# Patient Record
Sex: Male | Born: 1965
Health system: Southern US, Community
[De-identification: ages and names within clinical notes are randomized; demographics above are authoritative.]

## PROBLEM LIST (undated history)

## (undated) DIAGNOSIS — I10 Essential (primary) hypertension: Secondary | ICD-10-CM

## (undated) DIAGNOSIS — K219 Gastro-esophageal reflux disease without esophagitis: Secondary | ICD-10-CM

## (undated) DIAGNOSIS — E7800 Pure hypercholesterolemia, unspecified: Secondary | ICD-10-CM

## (undated) DIAGNOSIS — C73 Malignant neoplasm of thyroid gland: Secondary | ICD-10-CM

## (undated) DIAGNOSIS — G4730 Sleep apnea, unspecified: Secondary | ICD-10-CM

## (undated) DIAGNOSIS — R609 Edema, unspecified: Secondary | ICD-10-CM

## (undated) DIAGNOSIS — E890 Postprocedural hypothyroidism: Secondary | ICD-10-CM

## (undated) HISTORY — PX: COLONOSCOPY: SHX174

## (undated) HISTORY — DX: Hypocalcemia: E83.51

## (undated) HISTORY — DX: Gastro-esophageal reflux disease without esophagitis: K21.9

## (undated) HISTORY — DX: Edema, unspecified: R60.9

## (undated) HISTORY — DX: Postprocedural hypothyroidism: E89.0

## (undated) HISTORY — DX: Malignant neoplasm of thyroid gland: C73

## (undated) HISTORY — DX: Essential (primary) hypertension: I10

---

## 2003-10-28 HISTORY — PX: UVULOPALATOPHARYNGOPLASTY: SHX827

## 2009-01-26 ENCOUNTER — Encounter (INDEPENDENT_AMBULATORY_CARE_PROVIDER_SITE_OTHER): Payer: Self-pay | Admitting: Diagnostic Radiology

## 2009-01-26 ENCOUNTER — Ambulatory Visit (HOSPITAL_COMMUNITY): Admission: RE | Admit: 2009-01-26 | Discharge: 2009-01-26 | Payer: Self-pay | Admitting: Endocrinology

## 2009-03-02 ENCOUNTER — Ambulatory Visit (HOSPITAL_COMMUNITY): Admission: RE | Admit: 2009-03-02 | Discharge: 2009-03-03 | Payer: Self-pay | Admitting: General Surgery

## 2009-03-02 ENCOUNTER — Encounter (INDEPENDENT_AMBULATORY_CARE_PROVIDER_SITE_OTHER): Payer: Self-pay | Admitting: General Surgery

## 2009-03-02 HISTORY — PX: TOTAL THYROIDECTOMY: SHX2547

## 2009-05-02 ENCOUNTER — Ambulatory Visit: Payer: Self-pay | Admitting: Endocrinology

## 2009-05-02 DIAGNOSIS — C73 Malignant neoplasm of thyroid gland: Secondary | ICD-10-CM

## 2009-05-02 DIAGNOSIS — K219 Gastro-esophageal reflux disease without esophagitis: Secondary | ICD-10-CM

## 2009-05-02 DIAGNOSIS — E890 Postprocedural hypothyroidism: Secondary | ICD-10-CM

## 2009-05-02 DIAGNOSIS — I10 Essential (primary) hypertension: Secondary | ICD-10-CM

## 2009-05-02 HISTORY — DX: Malignant neoplasm of thyroid gland: C73

## 2009-05-02 HISTORY — DX: Essential (primary) hypertension: I10

## 2009-05-02 HISTORY — DX: Gastro-esophageal reflux disease without esophagitis: K21.9

## 2009-05-02 HISTORY — DX: Postprocedural hypothyroidism: E89.0

## 2009-05-11 ENCOUNTER — Encounter (HOSPITAL_COMMUNITY): Admission: RE | Admit: 2009-05-11 | Discharge: 2009-07-26 | Payer: Self-pay | Admitting: Endocrinology

## 2009-05-21 ENCOUNTER — Encounter (HOSPITAL_COMMUNITY): Admission: RE | Admit: 2009-05-21 | Discharge: 2009-07-26 | Payer: Self-pay | Admitting: Endocrinology

## 2009-05-23 ENCOUNTER — Telehealth: Payer: Self-pay | Admitting: Internal Medicine

## 2009-06-06 ENCOUNTER — Encounter: Payer: Self-pay | Admitting: Endocrinology

## 2009-06-08 ENCOUNTER — Telehealth: Payer: Self-pay | Admitting: Endocrinology

## 2009-06-11 ENCOUNTER — Telehealth: Payer: Self-pay | Admitting: Endocrinology

## 2009-06-15 ENCOUNTER — Ambulatory Visit: Payer: Self-pay | Admitting: Endocrinology

## 2009-07-24 ENCOUNTER — Ambulatory Visit: Payer: Self-pay | Admitting: Endocrinology

## 2009-07-26 LAB — CONVERTED CEMR LAB: TSH: 0.4 microintl units/mL (ref 0.35–5.50)

## 2009-08-16 DIAGNOSIS — D229 Melanocytic nevi, unspecified: Secondary | ICD-10-CM

## 2009-08-16 HISTORY — DX: Melanocytic nevi, unspecified: D22.9

## 2009-09-07 ENCOUNTER — Telehealth (INDEPENDENT_AMBULATORY_CARE_PROVIDER_SITE_OTHER): Payer: Self-pay | Admitting: *Deleted

## 2009-09-28 ENCOUNTER — Ambulatory Visit: Payer: Self-pay | Admitting: Endocrinology

## 2009-09-29 LAB — CONVERTED CEMR LAB
TSH: 56.91 microintl units/mL — ABNORMAL HIGH (ref 0.35–5.50)
Thyroglobulin Ab: 51.7 (ref 0.0–60.0)

## 2009-10-02 ENCOUNTER — Ambulatory Visit: Payer: Self-pay | Admitting: Endocrinology

## 2009-10-03 ENCOUNTER — Telehealth: Payer: Self-pay | Admitting: Endocrinology

## 2009-10-15 ENCOUNTER — Encounter: Payer: Self-pay | Admitting: Endocrinology

## 2009-10-15 ENCOUNTER — Ambulatory Visit (HOSPITAL_COMMUNITY): Admission: RE | Admit: 2009-10-15 | Discharge: 2009-10-15 | Payer: Self-pay | Admitting: Endocrinology

## 2009-10-15 ENCOUNTER — Telehealth: Payer: Self-pay | Admitting: Endocrinology

## 2009-10-23 ENCOUNTER — Telehealth: Payer: Self-pay | Admitting: Endocrinology

## 2009-10-23 ENCOUNTER — Ambulatory Visit: Payer: Self-pay | Admitting: Endocrinology

## 2009-10-23 LAB — CONVERTED CEMR LAB: TSH: 83.58 microintl units/mL — ABNORMAL HIGH (ref 0.35–5.50)

## 2009-10-24 ENCOUNTER — Telehealth: Payer: Self-pay | Admitting: Endocrinology

## 2009-10-31 ENCOUNTER — Telehealth: Payer: Self-pay | Admitting: Endocrinology

## 2009-10-31 ENCOUNTER — Ambulatory Visit (HOSPITAL_COMMUNITY): Admission: RE | Admit: 2009-10-31 | Discharge: 2009-10-31 | Payer: Self-pay | Admitting: Endocrinology

## 2009-11-02 ENCOUNTER — Ambulatory Visit (HOSPITAL_COMMUNITY): Admission: RE | Admit: 2009-11-02 | Discharge: 2009-11-02 | Payer: Self-pay | Admitting: Endocrinology

## 2009-11-02 ENCOUNTER — Telehealth: Payer: Self-pay | Admitting: Endocrinology

## 2009-11-12 ENCOUNTER — Ambulatory Visit: Payer: Self-pay | Admitting: Internal Medicine

## 2009-11-12 ENCOUNTER — Ambulatory Visit (HOSPITAL_COMMUNITY): Admission: RE | Admit: 2009-11-12 | Discharge: 2009-11-12 | Payer: Self-pay | Admitting: Endocrinology

## 2009-11-12 DIAGNOSIS — R609 Edema, unspecified: Secondary | ICD-10-CM

## 2009-11-12 HISTORY — DX: Edema, unspecified: R60.9

## 2009-12-17 ENCOUNTER — Encounter: Payer: Self-pay | Admitting: Endocrinology

## 2009-12-20 ENCOUNTER — Ambulatory Visit: Payer: Self-pay | Admitting: Endocrinology

## 2009-12-24 ENCOUNTER — Encounter: Payer: Self-pay | Admitting: Internal Medicine

## 2009-12-24 ENCOUNTER — Ambulatory Visit (HOSPITAL_COMMUNITY): Admission: RE | Admit: 2009-12-24 | Discharge: 2009-12-24 | Payer: Self-pay | Admitting: General Surgery

## 2009-12-30 ENCOUNTER — Telehealth: Payer: Self-pay | Admitting: Endocrinology

## 2010-01-07 ENCOUNTER — Encounter: Payer: Self-pay | Admitting: Endocrinology

## 2010-03-15 ENCOUNTER — Ambulatory Visit: Payer: Self-pay | Admitting: Endocrinology

## 2010-03-18 LAB — CONVERTED CEMR LAB: Thyroglobulin Ab: 48 (ref 0.0–60.0)

## 2010-03-20 ENCOUNTER — Ambulatory Visit: Payer: Self-pay | Admitting: Endocrinology

## 2010-04-26 ENCOUNTER — Ambulatory Visit: Payer: Self-pay | Admitting: Endocrinology

## 2010-05-02 ENCOUNTER — Telehealth: Payer: Self-pay | Admitting: Endocrinology

## 2010-05-10 IMAGING — CT NM PET TUM IMG RESTAG (PS) SKULL BASE T - THIGH
1 of 6 series · 1 of 25 positions shown · IV contrast (350 RC)
Comparison: 10/18/2009

CLINICAL DATA: Initial treatment strategy for malignant neoplasm
of the thyroid gland.

NUCLEAR MEDICINE PET CT INITIAL (PI) SKULL BASE TO THIGH
TECHNIQUE: 14.8 mCi F-18 FDG was injected intravenously via the
right antecubital fossa.  Full-ring PET imaging was performed from
the skull base through the mid-thighs 55  minutes after injection.
CT data was obtained and used for attenuation correction and
anatomic localization only.  (This was not acquired as a diagnostic
CT examination.)
Fasting Blood Glucose:  111

[Series 2: ct images · axial · 3.8mm · 0.98mm/px · 1 of 307 slices shown]
[im 307/307  brain]
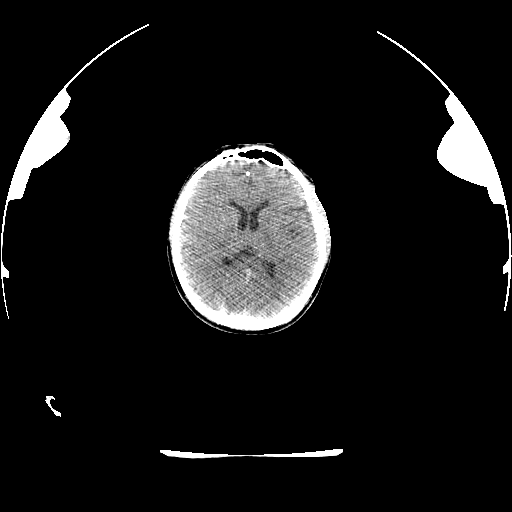

[1 of 25 positions shown; findings below may reference images not displayed]

FINDINGS: No hypermetabolic lymph nodes within the neck.

Postoperative change consistent with prior thyroidectomy and
ablation.

No hypermetabolic cervical lymph nodes.

Left supraclavicular lymph node measures 0.7 cm.  This has an SUV
max equal to 3.1.

No hypermetabolic axillary lymph nodes.

There is no abnormal FDG uptake within the mediastinum or hilum.

No pericardial or pleural effusions identified.

No hypermetabolic pulmonary nodules or masses identified.

There is no abnormal uptake within the liver or spleen.

The adrenal glands both appear normal.

The pancreas appears normal.

No hypermetabolic pelvic or inguinal lymph nodes.

There are no specific features to suggest  hypermetabolic bone
metastases.
IMPRESSION: 1.  There is a single left supraclavicular lymph node which
exhibits mild to moderate increased FDG uptake.  This may represent
a focus of thyroid cancer metastases.
2.  No evidence for hypermetabolic tumor within the chest ,abdomen
or pelvis.

## 2010-05-13 ENCOUNTER — Encounter (HOSPITAL_COMMUNITY): Admission: RE | Admit: 2010-05-13 | Discharge: 2010-07-24 | Payer: Self-pay | Admitting: Endocrinology

## 2010-06-18 ENCOUNTER — Encounter: Payer: Self-pay | Admitting: Endocrinology

## 2010-07-04 ENCOUNTER — Ambulatory Visit: Payer: Self-pay | Admitting: Endocrinology

## 2010-07-04 HISTORY — DX: Hypocalcemia: E83.51

## 2010-07-04 LAB — CONVERTED CEMR LAB
Calcium, Total (PTH): 9.5 mg/dL (ref 8.4–10.5)
PTH: 46.9 pg/mL (ref 14.0–72.0)
TSH: 0.36 u[IU]/mL (ref 0.35–5.50)

## 2010-08-01 ENCOUNTER — Telehealth: Payer: Self-pay | Admitting: Endocrinology

## 2010-08-30 ENCOUNTER — Ambulatory Visit: Payer: Self-pay | Admitting: Endocrinology

## 2010-09-11 ENCOUNTER — Ambulatory Visit: Payer: Self-pay | Admitting: Endocrinology

## 2010-09-12 LAB — CONVERTED CEMR LAB: TSH: 21.87 microintl units/mL — ABNORMAL HIGH (ref 0.35–5.50)

## 2010-11-17 ENCOUNTER — Encounter: Payer: Self-pay | Admitting: Endocrinology

## 2010-11-18 ENCOUNTER — Encounter: Payer: Self-pay | Admitting: Endocrinology

## 2010-11-24 LAB — CONVERTED CEMR LAB
AST: 44 units/L — ABNORMAL HIGH (ref 0–37)
Albumin: 4.5 g/dL (ref 3.5–5.2)
BUN: 14 mg/dL (ref 6–23)
Basophils Absolute: 0 10*3/uL (ref 0.0–0.1)
CO2: 28 meq/L (ref 19–32)
Chloride: 106 meq/L (ref 96–112)
Eosinophils Absolute: 0.1 10*3/uL (ref 0.0–0.7)
Glucose, Bld: 84 mg/dL (ref 70–99)
HCT: 42.3 % (ref 39.0–52.0)
Hemoglobin, Urine: NEGATIVE
Hemoglobin: 14.2 g/dL (ref 13.0–17.0)
Lymphs Abs: 0.9 10*3/uL (ref 0.7–4.0)
MCHC: 33.6 g/dL (ref 30.0–36.0)
MCV: 90.3 fL (ref 78.0–100.0)
Neutro Abs: 2.2 10*3/uL (ref 1.4–7.7)
Nitrite: NEGATIVE
Potassium: 3.8 meq/L (ref 3.5–5.1)
RDW: 13 % (ref 11.5–14.6)
Specific Gravity, Urine: 1.01 (ref 1.000–1.030)
Total Protein, Urine: NEGATIVE mg/dL

## 2010-11-26 NOTE — Progress Notes (Signed)
Summary: Pt req  Phone Note Call from Patient Call back at Home Phone [phone]   Caller: Spouse Summary of Call: Pt's spouse called requesting to take Cytomel for a while and then stop, while waiting for scan? Initial call taken by: Margaret Pyle, CMA,  May 02, 2010 3:14 PM  Follow-up for Phone Call        if you had to wait longer for the scan, that would be a good idea.  however, good news--you are ready for the scan now.  you will be called with a day and time for an appointment. Follow-up by: Minus Breeding MD,  May 02, 2010 3:57 PM  Additional Follow-up for Phone Call Additional follow up Details #1::        Pt's advised and was irate stating that SAE advised that appt was supposed to be mad last week. Additional Follow-up by: Margaret Pyle, CMA,  May 02, 2010 4:26 PM

## 2010-11-26 NOTE — Letter (Signed)
Summary: [facility]   Imported By: Sherian Rein 03/01/2010 13:49:47  _____________________________________________________________________  External Attachment:    Type:   Image     Comment:   External Document

## 2010-11-26 NOTE — Assessment & Plan Note (Signed)
Summary: "swollen all over" / sae / cd   Vital Signs:  Patient profile:   45 year old male Height:      74 inches Weight:      248 pounds BMI:     31.96 O2 Sat:      95 % on Room air Temp:     99.2 degrees F oral Pulse rate:   81 / minute BP sitting:   150 / 104  (left arm) Cuff size:   large  Vitals Entered ByZella Ball Ewing (November 12, 2009 1:29 PM)  O2 Flow:  Room air CC: swollen all over/RE   Primary Care Provider:  Dr Doreen Beam  CC:  swollen all over/RE.  History of Present Illness: s/p  radioactive Iodine in july and early jan, after surgical  removal in may 2010;  here today wih low grade temp, St and hoarsenss, as well a puffiness to hand and ankles; abd sweling, feels cold ,memory slowed down,  dry mouth , cheek swelling;  now back on the thyroid medication for 3 days only., requesting mild diuretic due to discomfort to the legs; wt up from 232 to 248 - mostly fluid he suspects but also had the holidays in between;   has appt approx 6 wks to return;  has some change in urination with overall less urine volume;  similar overall to episode of fluid retention in july 2010 but "took forever for the swelling to go away";  Pt denies CP, sob, doe, wheezing, orthopnea, pnd, palps, dizziness or syncope . Pt denies new neuro symptoms such as headache, facial or extremity weakness     Problems Prior to Update: 1)  Peripheral Edema  (ICD-782.3) 2)  Hypothyroidism, Postsurgical  (ICD-244.0) 3)  Hypertension  (ICD-401.9) 4)  G E R D  (ICD-530.81) 5)  Malignant Neoplasm of Thyroid Gland  (ICD-193)  Medications Prior to Update: 1)  Lisinopril 20 Mg Tabs (Lisinopril) .Marland Kitchen.. 1 Tab Two Times A Day 2)  Omeprazole 40 Mg Cpdr (Omeprazole) .Marland Kitchen.. 1 Daily 3)  Levothyroxine Sodium 200 Mcg Solr (Levothyroxine Sodium) .Marland Kitchen.. 1 Qd 4)  Thyroid Nuc Med Total Body Scan .Marland Kitchen.. 193  Current Medications (verified): 1)  Lisinopril 20 Mg Tabs (Lisinopril) .Marland Kitchen.. 1 Tab Two Times A Day 2)  Omeprazole 40 Mg Cpdr  (Omeprazole) .Marland Kitchen.. 1 Daily 3)  Levothyroxine Sodium 200 Mcg Solr (Levothyroxine Sodium) .Marland Kitchen.. 1 Qd 4)  Thyroid Nuc Med Total Body Scan .Marland Kitchen.. 193 5)  Furosemide 20 Mg Tabs (Furosemide) .Marland Kitchen.. 1po Once Daily As Needed Swelling 6)  Klor-Con 10 10 Meq Cr-Tabs (Potassium Chloride) .Marland Kitchen.. 1 By Mouth Once Daily  Allergies (verified): No Known Drug Allergies  Past History:  Past Medical History: Last updated: 07/24/2009 HYPOTHYROIDISM, POSTSURGICAL (ICD-244.0) HYPERTENSION (ICD-401.9) G E R D (ICD-530.81) MALIGNANT NEOPLASM OF THYROID GLAND (ICD-193) follicular variant of papillary thyroid cancer 5/10  thyroidectomy, t3 M1 No 7/10  i-131 156 mci 7/10  post-therapy scan has uptake at the thyroid bed only  Past Surgical History: Last updated: 05/02/2009 Complete Throidectomy- Mar 02, 2009 Sleep Apnea Surg- 2005  Social History: Last updated: 05/02/2009 married works as a Chartered certified accountant  Review of Systems       all otherwise negative per pt -  Physical Exam  General:  alert and overweight-appearing.   Head:  normocephalic and atraumatic.   Eyes:  vision grossly intact, pupils equal, and pupils round.   Ears:  R ear normal and L ear normal.   Nose:  no external  deformity and no nasal discharge.   Mouth:  no gingival abnormalities and pharynx pink and moist.   Neck:  supple and no masses.   Lungs:  normal respiratory effort and normal breath sounds.   Heart:  normal rate and regular rhythm.   Abdomen:  soft, non-tender, and normal bowel sounds. but somewhat distended ? ascites Msk:  no joint tenderness and no joint swelling.   Extremities:  1+ left pedal edema. bilat Neurologic:  alert & oriented X3 and strength normal in all extremities.     Impression & Recommendations:  Problem # 1:  HYPOTHYROIDISM, POSTSURGICAL (ICD-244.0)  His updated medication list for this problem includes:    Levothyroxine Sodium 200 Mcg Solr (Levothyroxine sodium) .Marland Kitchen... 1 qd just re-started levothyroxine ,  to cont same dose for now, recent TSH 84 on dec 28; has f/u with dr Everardo All scheduled  Problem # 2:  PERIPHERAL EDEMA (ICD-782.3)  most likely due to above, cant r/o mild ascites as well;; to check ekg, and routine labs to r/o renal, anemia, etc  - to add the lasix 20 mg as needed, ok to go back to work tomorrow as planned Orders: EKG w/ Interpretation (93000) TLB-BMP (Basic Metabolic Panel-BMET) (80048-METABOL) TLB-CBC Platelet - w/Differential (85025-CBCD) TLB-Hepatic/Liver Function Pnl (80076-HEPATIC) TLB-Udip ONLY (81003-UDIP)  His updated medication list for this problem includes:    Furosemide 20 Mg Tabs (Furosemide) .Marland Kitchen... 1po once daily as needed swelling  Problem # 3:  HYPERTENSION (ICD-401.9)  His updated medication list for this problem includes:    Lisinopril 20 Mg Tabs (Lisinopril) .Marland Kitchen... 1 tab two times a day    Furosemide 20 Mg Tabs (Furosemide) .Marland Kitchen... 1po once daily as needed swelling  BP today: 150/104 Prior BP: 122/90 (07/24/2009) mild elev today, likely situational, ok to follow, continue same treatment   Complete Medication List: 1)  Lisinopril 20 Mg Tabs (Lisinopril) .Marland Kitchen.. 1 tab two times a day 2)  Omeprazole 40 Mg Cpdr (Omeprazole) .Marland Kitchen.. 1 daily 3)  Levothyroxine Sodium 200 Mcg Solr (Levothyroxine sodium) .Marland Kitchen.. 1 qd 4)  Thyroid Nuc Med Total Body Scan  .Marland Kitchen.. 193 5)  Furosemide 20 Mg Tabs (Furosemide) .Marland Kitchen.. 1po once daily as needed swelling 6)  Klor-con 10 10 Meq Cr-tabs (Potassium chloride) .Marland Kitchen.. 1 by mouth once daily  Patient Instructions: 1)  Please take all new medications as prescribed 2)  Continue all previous medications as before this visit  3)  Your EKG was good today 4)  Please go to the Lab in the basement for your blood and/or urine tests today 5)  please followup with Dr Everardo All as planned  Prescriptions: KLOR-CON 10 10 MEQ CR-TABS (POTASSIUM CHLORIDE) 1 by mouth once daily  #30 x 1   Entered and Authorized by:   Corwin Levins MD   Signed by:   Corwin Levins MD on 11/12/2009   Method used:   Print then Give to Patient   RxID:   [id number] FUROSEMIDE 20 MG TABS (FUROSEMIDE) 1po once daily as needed swelling  #30 x 1   Entered and Authorized by:   Corwin Levins MD   Signed by:   Corwin Levins MD on 11/12/2009   Method used:   Print then Give to Patient   RxID:   [id number]

## 2010-11-26 NOTE — Letter (Signed)
Summary: [facility]   Imported By: Lanelle Bal 01/21/2010 11:27:04  _____________________________________________________________________  External Attachment:    Type:   Image     Comment:   External Document

## 2010-11-26 NOTE — Letter (Signed)
Summary: [facility]   Imported By: Sherian Rein 12/24/2009 14:41:40  _____________________________________________________________________  External Attachment:    Type:   Image     Comment:   External Document

## 2010-11-26 NOTE — Progress Notes (Signed)
Summary: Levothyroxine Question  Phone Note Call from Patient Call back at Home Phone [phone]   Summary of Call: Patient spouse called, and patient will require another body scan on 11-12-09 at 7:30 am. What should patient do in regards to his levothyroxine? Please advise. Initial call taken by: Lucious Groves,  November 02, 2009 3:15 PM  Follow-up for Phone Call        he can resume his medication 2 days after his treatment pill (which i believe was today).   Follow-up by: Minus Breeding MD,  November 02, 2009 3:43 PM  Additional Follow-up for Phone Call Additional follow up Details #1::        Patient aware but is unsure if it should be stopped again before the scan on the 17th. Additional Follow-up by: Lucious Groves,  November 02, 2009 3:45 PM    Additional Follow-up for Phone Call Additional follow up Details #2::    no, you can resume it 2 day after you take the pill, and you do not need to stop it for the x ray Follow-up by: Minus Breeding MD,  November 02, 2009 3:48 PM  Additional Follow-up for Phone Call Additional follow up Details #3:: Details for Additional Follow-up Action Taken: Spouse and patient notified. Patient just had the scans so will resume as planned. Additional Follow-up by: Lucious Groves,  November 02, 2009 4:24 PM

## 2010-11-26 NOTE — Letter (Signed)
Summary: [facility]   Imported By: Lester [place] 07/02/2010 12:37:32  _____________________________________________________________________  External Attachment:    Type:   Image     Comment:   External Document

## 2010-11-26 NOTE — Assessment & Plan Note (Signed)
Summary: follow up for labwork/?radiation/lb   Vital Signs:  Patient profile:   45 year old male Height:      74 inches (187.96 cm) Weight:      239 pounds (108.64 kg) BMI:     30.80 O2 Sat:      96 % on Room air Temp:     98.7 degrees F (37.06 degrees C) oral Pulse rate:   100 / minute BP sitting:   118 / 80  (left arm) Cuff size:   large  Vitals Entered By: Brenton Grills CMA Duncan Dull) (August 30, 2010 4:16 PM)  O2 Flow:  Room air CC: F/U on thyroid/aj Is Patient Diabetic? No   Referring Provider:  Dr Claud Kelp Primary Provider:  Dr Doreen Beam  CC:  F/U on thyroid/aj.  History of Present Illness: the status of at least 3 ongoing medical problems is addressed today: thyroid cancer:  he does not notice any nodule at the head or neck.   postsurgical hypothyroidism:  on synthroid 175 micrograms/day, pt states he feels well in general hypocalcemia: was noted in 2010, after his surgery.  he occasionally takes a ca++ pill.  no cramps.  Current Medications (verified): 1)  Lisinopril 20 Mg Tabs (Lisinopril) .Marland Kitchen.. 1 Tab Two Times A Day 2)  Omeprazole 40 Mg Cpdr (Omeprazole) .Marland Kitchen.. 1 Daily 3)  Furosemide 20 Mg Tabs (Furosemide) .Marland Kitchen.. 1po Once Daily As Needed Swelling 4)  Synthroid 175 Mcg Tabs (Levothyroxine Sodium) .Marland Kitchen.. 1 Once Daily  Allergies (verified): No Known Drug Allergies  Past History:  Past Medical History: HYPOTHYROIDISM, POSTSURGICAL (ICD-244.0) HYPERTENSION (ICD-401.9) G E R D (ICD-530.81) MALIGNANT NEOPLASM OF THYROID GLAND (ICD-193) follicular variant of papillary thyroid cancer 5/10  thyroidectomy, t3 M1 No 7/10  i-131 156 mci 7/10  post-therapy scan has uptake at the thyroid bed only 1/11  i-131 195 mci 1/11  post-therapy scan pos at the neck 1/11  pet scan pos for node at left supraclavicular area 5/11  thyroglobulin (on synthroid)=10.5 (ab borderline) 7/11 thyroglobilin=354 (off synthroid) (ab neg) 9/11  thyroglobulin (on synthroid)=12  ab  neg  Review of Systems  The patient denies abdominal pain.         he has slight swelling of the abdomen  Physical Exam  General:  normal appearance.   Neck:  a healed scar is present.  i do not appreciate a nodule in the thyroid or elsewhere in the neck    Impression & Recommendations:  Problem # 1:  HYPOCALCEMIA (ICD-275.41) Assessment Improved  Problem # 2:  HYPOTHYROIDISM, POSTSURGICAL (ICD-244.0) he'll need to go-off synthroid in order to re-assess thyroglobilin).  Problem # 3:  MALIGNANT NEOPLASM OF THYROID GLAND (ICD-193) stage-1, but if this had been dx'ed just a few mos later, it would have beeen stage-3.  Other Orders: Est. Patient Level IV ([address])  Patient Instructions: 1)  stop levothyroxine. 2)  go to lab in 10-14 days for blood tests (tsh 244.0, and thyroglobulin panel 193). 3)  blood tests are being ordered for you today.  please call [phone] to hear your test results.   Orders Added: 1)  Est. Patient Level IV [81191]

## 2010-11-26 NOTE — Assessment & Plan Note (Signed)
Summary: Jorge Ellis--STC   Vital Signs:  Patient profile:   45 year old male Height:      74 inches (187.96 cm) Weight:      234.13 pounds (106.42 kg) O2 Sat:      96 % on Room air Temp:     98.8 degrees F (37.11 degrees C) oral Pulse rate:   81 / minute BP sitting:   118 / 88  (left arm) Cuff size:   large  Vitals Entered By: Josph Macho RMA (Jorge 25, 2011 7:47 AM)  O2 Flow:  Room air CC: Follow-up visit/ CF Is Patient Diabetic? No   Referring Provider:  Dr Claud Kelp Primary Provider:  Dr Doreen Beam  CC:  Follow-up visit/ CF.  History of Present Illness: pt says he does not notice any lump in the neck. he feels better since he has been back on the synthroid.  Current Medications (verified): 1)  Lisinopril 20 Mg Tabs (Lisinopril) .Marland Kitchen.. 1 Tab Two Times A Day 2)  Omeprazole 40 Mg Cpdr (Omeprazole) .Marland Kitchen.. 1 Daily 3)  Levothyroxine Sodium 200 Mcg Solr (Levothyroxine Sodium) .Marland Kitchen.. 1 Qd 4)  Thyroid Nuc Med Total Body Scan .Marland Kitchen.. 193 5)  Furosemide 20 Mg Tabs (Furosemide) .Marland Kitchen.. 1po Once Daily As Needed Swelling 6)  Klor-Con 10 10 Meq Cr-Tabs (Potassium Chloride) .Marland Kitchen.. 1 By Mouth Once Daily  Allergies (verified): No Known Drug Allergies  Past History:  Past Medical History: HYPOTHYROIDISM, POSTSURGICAL (ICD-244.0) HYPERTENSION (ICD-401.9) G E R D (ICD-530.81) MALIGNANT NEOPLASM OF THYROID GLAND (ICD-193) follicular variant of papillary thyroid cancer 5/10  thyroidectomy, t3 M1 No 7/10  i-131 156 mci 7/10  post-therapy scan has uptake at the thyroid bed only 1/11  i-131 195 mci 1/11  post-therapy scan pos at the neck 1/11  pet scan pos for node at left supraclavicular area  Review of Systems       fatigue is improved  Physical Exam  General:  normal appearance.   Neck:  a healed scar is present.  i do not appreciate a nodule in the thyroid or elsewhere in the neck  Additional Exam:  Thyroglobulin Antibody     48.0 U/mL                   0.0-60.0 Thyroglobulin              10.5 ng/mL  FastTSH              [L]  0.04 uIU/mL      Impression & Recommendations:  Problem # 1:  MALIGNANT NEOPLASM OF THYROID GLAND (ICD-193) Assessment Improved  Problem # 2:  HYPOTHYROIDISM, POSTSURGICAL (ICD-244.0) overreplaced  Medications Added to Medication List This Visit: 1)  Synthroid 175 Mcg Tabs (Levothyroxine sodium) .Marland Kitchen.. 1 once daily 2)  Cytomel 25 Mcg Tabs (Liothyronine sodium) .... 1/2 tab two times a day  Other Orders: Est. Patient Level III (24401)  Patient Instructions: 1)  reduce synthroid to 175 micrograms/day. 2)  on 04/05/10, change synthroid to cytomel 1/2 of 25 micrograms two times a day 3)  stop cytomel 04/15/10 4)  blood tests 04/26/10 thyroglobulin panel 193  and tsh 244.0 5)  plan will be for scan few days after that. Prescriptions: CYTOMEL 25 MCG TABS (LIOTHYRONINE SODIUM) 1/2 tab two times a day  #30 x 0   Entered and Authorized by:   Minus Breeding MD   Signed by:   Minus Breeding MD on 03/20/2010   Method used:  Electronically to        [facility] (retail)       [address]       [place], [place]  [address]       Ph: [phone]       Fax: [phone]   RxID:   [id number] SYNTHROID 175 MCG TABS (LEVOTHYROXINE SODIUM) 1 once daily  #30 x 11   Entered and Authorized by:   Minus Breeding MD   Signed by:   Minus Breeding MD on 03/20/2010   Method used:   Electronically to        [facility] (retail)       [address]       [place], [place]  [address]       Ph: [phone]       Fax: [phone]   RxID:   [id number]

## 2010-11-26 NOTE — Assessment & Plan Note (Signed)
Summary: FU ON SCAN/NWS   Vital Signs:  Patient profile:   45 year old male Height:      74 inches (187.96 cm) Weight:      236.25 pounds (107.39 kg) O2 Sat:      96 % on Room air Temp:     99.2 degrees F (37.33 degrees C) oral Pulse rate:   77 / minute BP sitting:   110 / 82  (left arm) Cuff size:   large  Vitals Entered By: Josph Macho RMA (December 20, 2009 1:33 PM)  O2 Flow:  Room air CC: Follow-up after scan/ CF Is Patient Diabetic? No   Referring Provider:  Dr Claud Kelp Primary Provider:  Dr Doreen Beam  CC:  Follow-up after scan/ CF.  History of Present Illness: pt is here to review the therapy of his thyroid cancer to this point.  pt states she feels well in general.    Current Medications (verified): 1)  Lisinopril 20 Mg Tabs (Lisinopril) .Marland Kitchen.. 1 Tab Two Times A Day 2)  Omeprazole 40 Mg Cpdr (Omeprazole) .Marland Kitchen.. 1 Daily 3)  Levothyroxine Sodium 200 Mcg Solr (Levothyroxine Sodium) .Marland Kitchen.. 1 Qd 4)  Thyroid Nuc Med Total Body Scan .Marland Kitchen.. 193 5)  Furosemide 20 Mg Tabs (Furosemide) .Marland Kitchen.. 1po Once Daily As Needed Swelling 6)  Klor-Con 10 10 Meq Cr-Tabs (Potassium Chloride) .Marland Kitchen.. 1 By Mouth Once Daily  Allergies (verified): No Known Drug Allergies  Past History:  Past Medical History: Last updated: 07/24/2009 HYPOTHYROIDISM, POSTSURGICAL (ICD-244.0) HYPERTENSION (ICD-401.9) G E R D (ICD-530.81) MALIGNANT NEOPLASM OF THYROID GLAND (ICD-193) follicular variant of papillary thyroid cancer 5/10  thyroidectomy, t3 M1 No 7/10  i-131 156 mci 7/10  post-therapy scan has uptake at the thyroid bed only  Review of Systems       no neck sxs  Physical Exam  General:  normal appearance.   Neck:  a healed scar is present.  i do not appreciate a nodule in the thyroid or elsewhere in the neck  Additional Exam:  FastTSH              [L]  0.23 uIU/mL    Impression & Recommendations:  Problem # 1:  MALIGNANT NEOPLASM OF THYROID GLAND (ICD-193) he is stage due to age  less than 45 at time of dx, but he would be stage 3 had he been dx'ed just months later.  Problem # 2:  HYPOTHYROIDISM, POSTSURGICAL (ICD-244.0) well-replaced for this situation  Other Orders: TLB-TSH (Thyroid Stimulating Hormone) (84443-TSH) Est. Patient Level IV ([address])  Patient Instructions: 1)  tests are being ordered for you today.  a few days after the test(s), please call [phone] to hear your test results. 2)  pending the test results, please continue the same medications for now 3)  return may, with blood tests prior;  tsh 244.1, and thyroglobulin panel 193. 4)  i made copies of test results for pt today.   5)  20 minutes of discussion about the treatment of his cancer, in overall 30 minute visit.

## 2010-11-26 NOTE — Progress Notes (Signed)
----   Converted from flag ---- ---- 12/20/2009 2:31 PM, Minus Breeding MD wrote: Shirlee Latch results tues ------------------------------  i tried to call pt 12/30/09, to discuss mri results.  phone does not take "blocked" calls.

## 2010-11-26 NOTE — Progress Notes (Signed)
Summary: refill request  Phone Note Call from Patient Call back at Home Phone [phone]   Caller: Patient Summary of Call: pt called requesting refill of Cytomel while waiting for appt to have radiation done. okay to fill? Initial call taken by: Margaret Pyle, CMA,  October 31, 2009 2:10 PM  Follow-up for Phone Call        when is appointment for the iodine Follow-up by: Minus Breeding MD,  October 31, 2009 5:49 PM  Additional Follow-up for Phone Call Additional follow up Details #1::        Appt scheduled for  jan 7 10:00 welsey long - pt wife penny informed -  Additional Follow-up by: Shelbie Proctor,  November 01, 2009 3:35 PM    Additional Follow-up for Phone Call Additional follow up Details #2::    please resume the levothyroxine 200 micrograms/day, 3 days after the i-131 therapy.  return 6 weeks later Follow-up by: Minus Breeding MD,  November 02, 2009 12:55 AM  Additional Follow-up for Phone Call Additional follow up Details #3:: Details for Additional Follow-up Action Taken: Patient notified. Additional Follow-up by: Lucious Groves,  November 02, 2009 8:59 AM

## 2010-11-26 NOTE — Assessment & Plan Note (Signed)
Summary: FU---D/T---STC   Vital Signs:  Patient profile:   45 year old male Height:      74 inches (187.96 cm) Weight:      242.38 pounds (110.17 kg) BMI:     31.23 O2 Sat:      96 % on Room air Temp:     98.8 degrees F (37.11 degrees C) oral Pulse rate:   109 / minute BP sitting:   124 / 80  (left arm) Cuff size:   large  Vitals Entered By: Brenton Grills MA (July 04, 2010 4:00 PM)  O2 Flow:  Room air CC: Follow-up visit/pt is no longer taking Cytomel or Klor-Con/aj   Referring Provider:  Dr Claud Kelp Primary Provider:  Dr Doreen Beam  CC:  Follow-up visit/pt is no longer taking Cytomel or Klor-Con/aj.  History of Present Illness: the status of at least 3 ongoing medical problems is addressed today: thyroid cancer:  he does not notice any nodule at the head or neck.   postsurgical hypothyroidism:  on synthroid 175 micrograms/day, pt states he feels well in general, except for swelling of the abdomen hypocalcemia: was noted in 2010, after his surgery.  he occasionally takes a ca++ pill.  no cramps.  Current Medications (verified): 1)  Lisinopril 20 Mg Tabs (Lisinopril) .Marland Kitchen.. 1 Tab Two Times A Day 2)  Omeprazole 40 Mg Cpdr (Omeprazole) .Marland Kitchen.. 1 Daily 3)  Thyroid Nuc Med Total Body Scan .Marland Kitchen.. 193 4)  Furosemide 20 Mg Tabs (Furosemide) .Marland Kitchen.. 1po Once Daily As Needed Swelling 5)  Klor-Con 10 10 Meq Cr-Tabs (Potassium Chloride) .Marland Kitchen.. 1 By Mouth Once Daily 6)  Synthroid 175 Mcg Tabs (Levothyroxine Sodium) .Marland Kitchen.. 1 Once Daily 7)  Cytomel 25 Mcg Tabs (Liothyronine Sodium) .... 1/2 Tab Two Times A Day  Allergies (verified): No Known Drug Allergies  Past History:  Past Medical History: HYPOTHYROIDISM, POSTSURGICAL (ICD-244.0) HYPERTENSION (ICD-401.9) G E R D (ICD-530.81) MALIGNANT NEOPLASM OF THYROID GLAND (ICD-193) follicular variant of papillary thyroid cancer 5/10  thyroidectomy, t3 M1 No 7/10  i-131 156 mci 7/10  post-therapy scan has uptake at the thyroid bed  only 1/11  i-131 195 mci 1/11  post-therapy scan pos at the neck 1/11  pet scan pos for node at left supraclavicular area 5/11  thyroglobulin (on synthroid)=10.5 (ab borderline) 9/11  thyroglobulin (on synthroid)=12.  ab neg  Review of Systems       The patient complains of weight gain.         no constipation  Physical Exam  General:  normal appearance.   Neck:  a healed scar is present.  i do not appreciate a nodule in the thyroid or elsewhere in the neck  Additional Exam:  Thyroglobulin Antibody  neg Thyroglobulin             12.1 ng/mL                  0.0-55.0 Parathyroid Hormone       46.9 pg/mL                  14.0-72.0 Calcium                   9.5 mg/dL     FastTSH                   0.36 uIU/mL     Impression & Recommendations:  Problem # 1:  MALIGNANT NEOPLASM OF THYROID GLAND (ICD-193) Assessment Unchanged stage 1  he has some residual tumor, but it would be best to try to wait longer before any further i-131 therapy.  Problem # 2:  HYPOTHYROIDISM, POSTSURGICAL (ICD-244.0) tsh is not as low as goal, but synthroid was just decreased a few mos ago.    Problem # 3:  HYPOCALCEMIA (ICD-275.41) Assessment: Improved  Other Orders: T-Parathyroid Hormone, Intact w/ Calcium (01027-25366) T-Thyroglobulin Panel (44034, 74259-56387) TLB-TSH (Thyroid Stimulating Hormone) (84443-TSH) Est. Patient Level IV (56433)  Patient Instructions: 1)  blood tests are being ordered for you today.  please call [phone] to hear your test results. 2)  Please schedule a follow-up appointment in 4 months. 3)  (update: i left message on phone-tree:  rx as we discussed)

## 2010-11-26 NOTE — Progress Notes (Signed)
Summary: Rx refill req  Phone Note Refill Request Message from:  Patient on August 01, 2010 11:29 AM  Refills Requested: Medication #1:  SYNTHROID 175 MCG TABS 1 once daily.   Dosage confirmed as above?Dosage Confirmed   Supply Requested: 6 months  Method Requested: Electronic Initial call taken by: Margaret Pyle, CMA,  August 01, 2010 11:29 AM    Prescriptions: SYNTHROID 175 MCG TABS (LEVOTHYROXINE SODIUM) 1 once daily  #30 x 5   Entered by:   Margaret Pyle, CMA   Authorized by:   Minus Breeding MD   Signed by:   Margaret Pyle, CMA on 08/01/2010   Method used:   Electronically to        [facility] (retail)       [address]       [place], [place]  [address]       Ph: [phone]       Fax: [phone]   RxID:   [id number]

## 2011-01-12 LAB — GLUCOSE, CAPILLARY: Glucose-Capillary: 111 mg/dL — ABNORMAL HIGH (ref 70–99)

## 2011-01-20 ENCOUNTER — Other Ambulatory Visit: Payer: Self-pay | Admitting: Endocrinology

## 2011-02-04 LAB — URINALYSIS, ROUTINE W REFLEX MICROSCOPIC
Glucose, UA: NEGATIVE mg/dL
Ketones, ur: NEGATIVE mg/dL
Protein, ur: NEGATIVE mg/dL
Urobilinogen, UA: 0.2 mg/dL (ref 0.0–1.0)

## 2011-02-04 LAB — COMPREHENSIVE METABOLIC PANEL
ALT: 26 U/L (ref 0–53)
AST: 23 U/L (ref 0–37)
Albumin: 4 g/dL (ref 3.5–5.2)
Alkaline Phosphatase: 56 U/L (ref 39–117)
CO2: 29 mEq/L (ref 19–32)
Chloride: 106 mEq/L (ref 96–112)
GFR calc Af Amer: >60 mL/min (ref >60)
GFR calc non Af Amer: >60 mL/min (ref >60)
Potassium: 4.4 mEq/L (ref 3.5–5.1)
Total Bilirubin: 0.8 mg/dL (ref 0.3–1.2)

## 2011-02-04 LAB — DIFFERENTIAL
Basophils Absolute: 0 10*3/uL (ref 0.0–0.1)
Basophils Relative: 1 % (ref 0–1)
Eosinophils Absolute: 0 10*3/uL (ref 0.0–0.7)
Eosinophils Relative: 1 % (ref 0–5)
Monocytes Absolute: 0.4 10*3/uL (ref 0.1–1.0)

## 2011-02-04 LAB — CBC
Platelets: 164 10*3/uL (ref 150–400)
RBC: 4.96 MIL/uL (ref 4.22–5.81)
WBC: 5 10*3/uL (ref 4.0–10.5)

## 2011-02-04 LAB — CALCIUM: Calcium: 8.6 mg/dL (ref 8.4–10.5)

## 2011-03-11 NOTE — Op Note (Signed)
Jorge Ellis, Jorge Ellis                ACCOUNT NO.:  [id number]   MEDICAL RECORD NO.:  [id number]          PATIENT TYPE:  OIB   LOCATION:  0098                         FACILITY:  [facility]   PHYSICIAN:  Angelia Mould. Derrell Lolling, M.D.DATE OF BIRTH:  [birth year]   DATE OF PROCEDURE:  03/02/2009  DATE OF DISCHARGE:                               OPERATIVE REPORT   PREOPERATIVE DIAGNOSIS:  Bilateral thyroid nodules with suspicious  cytology.   POSTOPERATIVE DIAGNOSIS:  Bilateral thyroid nodules with suspicious  cytology.   OPERATION PERFORMED:  1. Excisional biopsy pretracheal lymph node.  2. Total thyroidectomy.   SURGEON:  Dr. Claud Kelp.   OPERATIVE INDICATIONS:  This is a 45 year old Caucasian gentleman who  noticed a lump in his right neck about 2 months ago, and he feels a  little bit of pressure, but no voice change or swallowing problems.  He  was evaluated for this.  Thyroid function tests are normal.  A thyroid  ultrasound showed a complex mass in the mid right lobe measuring 3 cm in  greatest dimension, predominantly cystic, but with blood flow in the  septations.  In the left lobe there was solid nodule measuring 1 cm.  Fine needle aspiration cytology of the smaller left nodule was  suspicious for a well-differentiated thyroid carcinoma.  The right  thyroid nodule showed adenomatous nodule versus follicular neoplasm.  On  exam, he has a soft palpable mass in the right neck, no adenopathy or  other mass.  He is brought to the operating room for a total  thyroidectomy.   OPERATIVE FINDINGS:  In the right thyroid lobe there was a 3 cm in  greatest dimension soft nodule in the mid upper pole.  On the left side  there was 1 cm nodule that was hard and whitish and was somewhat  suspicious.  There was a soft, but enlarged pretracheal lymph node below  the thyroid isthmus which I removed at the beginning of the case.  There  were no other palpably abnormal lymph nodes.   OPERATIVE  TECHNIQUE:  Following the induction of general endotracheal  anesthesia a surgical timeout was held identifying correct patient and  correct procedure.  The patient was positioned in a slightly head up  position, but with the neck extended.  The entire neck and upper chest  were prepped and draped in a sterile fashion.  The skin was marked with  the midline thyroid cartilage and clavicles.  A transverse incision was  made in the skin line about 2.5 cm above the sternal notch.  Dissection  was carried down through the subcutaneous tissue, through the platysma  muscle.  Skin and platysma flaps were raised superiorly and inferiorly  and a self-retaining retractor was placed.  The strap muscles were  divided in the midline and dissected off of the thyroid gland.   I dissected the right side first, dissecting the strap muscles off of  the thyroid gland itself.  I took the dissection up to the superior pole  and identified what I thought was the superior parathyroid gland.  We  kept the  dissection in the capsule and isolated the superior thyroid  vessels.  These were ligated in continuity with 2-0 silk ties and a  metal clip was placed for additional security and these vessels were  divided.  We further dissected the upper pole away from some smaller  vessels using the harmonic scalpel to divide these.  The lower pole on  the right was then mobilized again with the dissection staying  completely in the capsule of the thyroid gland, dissecting all the  tissues away.  We identified the inferior parathyroid gland on the right  and it was preserved.  Keeping the dissection right in the thyroid  capsule, we took the dissection down into the paratracheal area and  slowly mobilized the right thyroid lobe up off of the trachea.  We  identified what we thought was the recurrent laryngeal nerve and stayed  well away from this.  We divided the ligament of Berry and mobilized the  thyroid up off of the  trachea.   We dissected a pyramidal lobe down off of the trachea closely with  electrocautery, but the superior pole was clamped, divided and ligated  with 2-0 silk ties.   I then turned my attention to the left side.  A small firm nodule was  palpable in the mid thyroid, but it was not invading any adjacent  structures.  We took the left superior thyroid pole down, again  isolating the superior thyroid vessels.  These were skeletonized,  ligated in continuity with 2-0 silk ties and a metal clip.  There.  We  then divided and the superior pole was further dissected away.  The  lower pole was dissected away from surrounding structures, once again  staying completely within the capsule of the thyroid gland.  We then  dissected and mobilized the left lobe from lateral to medial up off of  the peritracheal tissues.  We identified the inferior parathyroid gland,  but I was not really sure I saw the left superior parathyroid gland.  The dissection stayed in the thyroid capsule and the thyroid came up off  of the trachea very easily and so we never really made a search for the  left recurrent laryngeal nerve.  We were quite confident that we had not  injured it.  The thyroid dissected up off of the trachea.  The right  superior pole was marked with a silk suture and the thyroid was sent for  routine histology.  The wound was irrigated with saline.  We got a  couple of tiny venous capillary bleeders which were controlled metal  clips and Surgicel.  We left the Surgicel in for about 5 minutes and  then removed it and there was no bleeding.  We placed a little bit more  Surgicel in the paratracheal area, watched this for a few more minutes  and when we were satisfied that there was no more bleeding, we concluded  the case.  The strap muscles were closed in the midline with interrupted  sutures of 3-0 Vicryl.  The platysmal muscle was closed with interrupted  sutures of 3-0 Vicryl and the skin  closed with a running subcuticular  suture of 4-0 Monocryl and Steri-Strips.  Clean bandages were placed and  the patient taken to the recovery room in stable condition.  Estimated  blood loss was about 30-40 mL.  Complications none.  Sponge, needle and  instrument counts were correct.      Angelia Mould. Derrell Lolling, M.D.  Electronically  Signed     HMI/MEDQ  D:  03/02/2009  T:  03/02/2009  Job:  [id number]   cc:   Reather Littler, M.D.  Fax: [phone]   Doreen Beam, MD  Fax: [phone]

## 2011-05-02 ENCOUNTER — Encounter: Payer: Self-pay | Admitting: Endocrinology

## 2011-05-02 ENCOUNTER — Ambulatory Visit (INDEPENDENT_AMBULATORY_CARE_PROVIDER_SITE_OTHER): Payer: PRIVATE HEALTH INSURANCE | Admitting: Endocrinology

## 2011-05-02 ENCOUNTER — Other Ambulatory Visit (INDEPENDENT_AMBULATORY_CARE_PROVIDER_SITE_OTHER): Payer: PRIVATE HEALTH INSURANCE

## 2011-05-02 DIAGNOSIS — C73 Malignant neoplasm of thyroid gland: Secondary | ICD-10-CM

## 2011-05-02 DIAGNOSIS — E890 Postprocedural hypothyroidism: Secondary | ICD-10-CM

## 2011-05-02 NOTE — Patient Instructions (Addendum)
blood tests are being ordered for you today.  please call [phone] to hear your test results.  You will be prompted to enter the 9-digit "MRN" number that appears at the top left of this page, followed by #.  Then you will hear the message. Based on the results, i may recommend another treatment with radioactive iodine. Please stay-off the levothyroxine for now. (update: i left message on phone-tree:  tg is unchanged.  Resume synthroid.  Check ultrasound of the neck.  Pending that results, we'll hold-off on further i-131 rx for now)

## 2011-05-02 NOTE — Progress Notes (Signed)
  Subjective:    Patient ID: Jorge Ellis, male    DOB: [birth year], 45 y.o.   MRN: [id number]  HPI Pt stopped synthroid 4 days ago, in anticipation of rechecking tg level.  Since then, he has slight cramps in the legs, but he has slight fatigue, but no assoc weight change MALIGNANT NEOPLASM OF THYROID GLAND (ICD-193)  follicular variant of papillary thyroid cancer  5/10 thyroidectomy, t3 M1 No  7/10 i-131 156 mci  7/10 post-therapy scan has uptake at the thyroid bed only  1/11 i-131 195 mci  1/11 post-therapy scan pos at the neck  1/11 pet scan pos for node at left supraclavicular area  5/11 thyroglobulin (on synthroid)=10.5 (ab borderline)  7/11 thyroglobilin=354 (off synthroid) (ab neg)  9/11 thyroglobulin (on synthroid)=12 (ab neg) 7/12: thyroglobulin (on synthroid)=13 (ab neg)    Review of Systems Denies weight change and myalgias    Objective:   Physical Exam GENERAL: no distress Neck: a healed scar is present.  i do not appreciate a nodule in the thyroid or elsewhere in the neck    Lab Results  Component Value Date   TSH 0.69 05/02/2011  (of synthroid x 4 days) tg=13 Assessment & Plan:  Postsurgical hypothyroidism.  tsh should be lower, but he had been off synthroid for a few days.   Papillary adenocarcinoma of the thyroid.  tg is stable since 2011.   He has h/o significant radiation exposure.  We need to weight the risks vs benefits of further i-131 rx.  i believe he will need more rx at some point.  However, it is known that spreading the exposure out over the longest possible time helps to reduce the adverse health effects.

## 2011-05-05 LAB — THYROGLOBULIN LEVEL: Thyroglobulin Ab: <20 U/mL (ref <40.0)

## 2011-05-05 NOTE — Patient Instructions (Signed)
i left message on phone tree Stay-off synthroid. i'll leave another message with tg level.

## 2011-05-09 ENCOUNTER — Ambulatory Visit (HOSPITAL_COMMUNITY)
Admission: RE | Admit: 2011-05-09 | Discharge: 2011-05-09 | Disposition: A | Discharge status: Home / Self Care | Payer: PRIVATE HEALTH INSURANCE | Source: Ambulatory Visit | Attending: Endocrinology | Admitting: Endocrinology

## 2011-05-09 DIAGNOSIS — C73 Malignant neoplasm of thyroid gland: Secondary | ICD-10-CM | POA: Insufficient documentation

## 2011-05-09 DIAGNOSIS — E0789 Other specified disorders of thyroid: Secondary | ICD-10-CM | POA: Insufficient documentation

## 2011-05-12 ENCOUNTER — Telehealth: Payer: Self-pay | Admitting: *Deleted

## 2011-05-12 NOTE — Telephone Encounter (Signed)
Message copied by Carin Primrose on Mon May 12, 2011  2:42 PM ------      Message from: Romero Belling      Created: Fri May 09, 2011  3:47 PM       i left message on phone tree      No rx needed now      ret 6 mos

## 2011-05-12 NOTE — Telephone Encounter (Signed)
Left message for pt to callback office.  

## 2011-05-13 NOTE — Telephone Encounter (Signed)
Pt informed of MD's advisement. 

## 2011-07-17 ENCOUNTER — Other Ambulatory Visit: Payer: Self-pay | Admitting: Endocrinology

## 2011-09-12 ENCOUNTER — Other Ambulatory Visit (INDEPENDENT_AMBULATORY_CARE_PROVIDER_SITE_OTHER): Payer: PRIVATE HEALTH INSURANCE

## 2011-09-12 ENCOUNTER — Encounter: Payer: Self-pay | Admitting: Endocrinology

## 2011-09-12 ENCOUNTER — Ambulatory Visit (INDEPENDENT_AMBULATORY_CARE_PROVIDER_SITE_OTHER): Payer: PRIVATE HEALTH INSURANCE | Admitting: Endocrinology

## 2011-09-12 DIAGNOSIS — E890 Postprocedural hypothyroidism: Secondary | ICD-10-CM

## 2011-09-12 DIAGNOSIS — C73 Malignant neoplasm of thyroid gland: Secondary | ICD-10-CM

## 2011-09-12 LAB — TSH: TSH: 0.36 u[IU]/mL (ref 0.35–5.50)

## 2011-09-12 NOTE — Progress Notes (Signed)
Subjective:    Patient ID: Jorge Ellis, male    DOB: 1966/10/09, 45 y.o.   MRN: [id number]  HPI Pt returns for f/u:  MALIGNANT NEOPLASM OF THYROID GLAND (ICD-193)  follicular variant of papillary thyroid cancer  5/10 thyroidectomy, t3 M1 No  7/10 i-131 156 mci  7/10 post-therapy scan has uptake at the thyroid bed only  1/11 i-131 195 mci  1/11 post-therapy scan pos at the neck  1/11 pet scan pos for node at left supraclavicular area  5/11 thyroglobulin (on synthroid)=10.5 (ab borderline)  7/11 thyroglobilin=354 (off synthroid) (ab neg)  9/11 thyroglobulin (on synthroid)=12 (ab neg)  7/12: thyroglobulin (on synthroid)=13 (ab neg) Past Medical History  Diagnosis Date  . Malignant neoplasm of thyroid gland 05/02/2009    follicular variant of papillary thyroid cancer 5/10 thyroidectomy, t3 M1 no 7/10 I-131 156 mci 7/10 post-therapy scan has uptake at the thryoid bed only 1/11 I-131 195 mci 1/11 post-therapy scan pos at the neck 1/11 pet scan pos for node at left supraclavicular area 5/11 thyroglobulin (on synthroid)=10.5 (ab borderline) 7/11 thyroglobulin=354 (off synthroid) (ab neg) 9/11 thyroglobulin =12 ab neg   . HYPOTHYROIDISM, POSTSURGICAL 05/02/2009  . Hypocalcemia 07/04/2010  . HYPERTENSION 05/02/2009  . PERIPHERAL EDEMA 11/12/2009  . G E R D 05/02/2009    Past Surgical History  Procedure Date  . Total thyroidectomy Mar 02, 2009  . Uvulopalatopharyngoplasty 2005    History   Social History  . Marital Status: Married    Spouse Name: N/A    Number of Children: N/A  . Years of Education: N/A   Occupational History  . Machinist    Social History Main Topics  . Smoking status: Never Smoker   . Smokeless tobacco: Not on file  . Alcohol Use: Not on file  . Drug Use: Not on file  . Sexually Active: Not on file   Other Topics Concern  . Not on file   Social History Narrative  . No narrative on file    Current Outpatient Prescriptions on File Prior to Visit  Medication  Sig Dispense Refill  . furosemide (LASIX) 20 MG tablet 1 tablet by mouth once daily as needed for swelling       . levothyroxine (SYNTHROID, LEVOTHROID) 175 MCG tablet TAKE ONE TABLET BY MOUTH EVERY DAY  30 tablet  10  . lisinopril (PRINIVIL,ZESTRIL) 20 MG tablet Take 20 mg by mouth 2 (two) times daily.        Marland Kitchen omeprazole (PRILOSEC) 40 MG capsule Take 40 mg by mouth daily.          No Known Allergies  Family History  Problem Relation Age of Onset  . Thyroid disease Mother   . Thyroid disease Sister     had thyroidectomy (benign)   BP 108/82  Pulse 73  Temp(Src) 98.4 F (36.9 C) (Oral)  Ht 6\' 2"  (1.88 m)  Wt 225 lb 8 oz (102.286 kg)  BMI 28.95 kg/m2  SpO2 96%  Review of Systems Denies difficulty swallowing or breathing.    Objective:   Physical Exam VITAL SIGNS:  See vs page GENERAL: no distress Neck: a healed scar is present.  i do not appreciate a nodule in the thyroid or elsewhere in the neck  Lab Results  Component Value Date   TSH 0.36 09/12/2011  thyroglobulin level is noted    Assessment & Plan:  (i discussed with dr Bradly Chris) Papillary adenocarcinoma of the thyroid, with persistently detectable thyroglobulin.  Postsurgical hypothyroidism, with  variable tsh.

## 2011-09-12 NOTE — Patient Instructions (Addendum)
blood tests are being requested for you today.  please call [phone] to hear your test results.  You will be prompted to enter the 9-digit "MRN" number that appears at the top left of this page, followed by #.  Then you will hear the message. The result would help [place] to know what other tests we need to do.   (update: i left message on phone-tree:  i ordered mri).

## 2011-09-15 LAB — THYROGLOBULIN LEVEL
Thyroglobulin Ab: <20 U/mL (ref <40.0)
Thyroglobulin: 26.5 ng/mL (ref 0.0–55.0)

## 2011-09-25 ENCOUNTER — Other Ambulatory Visit: Payer: Self-pay | Admitting: Endocrinology

## 2011-09-25 ENCOUNTER — Ambulatory Visit (HOSPITAL_COMMUNITY)
Admission: RE | Admit: 2011-09-25 | Discharge: 2011-09-25 | Disposition: A | Discharge status: Home / Self Care | Payer: PRIVATE HEALTH INSURANCE | Source: Ambulatory Visit | Attending: Endocrinology | Admitting: Endocrinology

## 2011-09-25 ENCOUNTER — Telehealth: Payer: Self-pay | Admitting: Endocrinology

## 2011-09-25 DIAGNOSIS — C73 Malignant neoplasm of thyroid gland: Secondary | ICD-10-CM

## 2011-09-25 DIAGNOSIS — E0789 Other specified disorders of thyroid: Secondary | ICD-10-CM | POA: Insufficient documentation

## 2011-09-25 LAB — POCT I-STAT, CHEM 8
Calcium, Ion: 1.17 mmol/L (ref 1.12–1.32)
Glucose, Bld: 111 mg/dL — ABNORMAL HIGH (ref 70–99)
HCT: 44 % (ref 39.0–52.0)
Hemoglobin: 15 g/dL (ref 13.0–17.0)
Potassium: 4.3 mEq/L (ref 3.5–5.1)

## 2011-09-25 MED ORDER — GADOBENATE DIMEGLUMINE 529 MG/ML IV SOLN
20.0000 mL | Freq: Once | INTRAVENOUS | Status: AC | PRN
  Administered 2011-09-25: 20 mL via INTRAVENOUS

## 2011-09-26 ENCOUNTER — Other Ambulatory Visit: Payer: Self-pay | Admitting: Endocrinology

## 2011-09-26 DIAGNOSIS — C73 Malignant neoplasm of thyroid gland: Secondary | ICD-10-CM

## 2011-10-07 ENCOUNTER — Other Ambulatory Visit (HOSPITAL_COMMUNITY): Payer: PRIVATE HEALTH INSURANCE

## 2011-10-13 ENCOUNTER — Encounter (HOSPITAL_COMMUNITY)
Admission: RE | Admit: 2011-10-13 | Discharge: 2011-10-13 | Disposition: A | Discharge status: Home / Self Care | Payer: PRIVATE HEALTH INSURANCE | Source: Ambulatory Visit | Attending: Endocrinology | Admitting: Endocrinology

## 2011-10-13 DIAGNOSIS — C73 Malignant neoplasm of thyroid gland: Secondary | ICD-10-CM

## 2011-10-17 ENCOUNTER — Encounter (HOSPITAL_COMMUNITY)
Admission: RE | Admit: 2011-10-17 | Discharge: 2011-10-17 | Disposition: A | Discharge status: Home / Self Care | Payer: PRIVATE HEALTH INSURANCE | Source: Ambulatory Visit | Attending: Endocrinology | Admitting: Endocrinology

## 2011-10-17 ENCOUNTER — Encounter (HOSPITAL_COMMUNITY): Payer: Self-pay

## 2011-10-17 DIAGNOSIS — C73 Malignant neoplasm of thyroid gland: Secondary | ICD-10-CM | POA: Insufficient documentation

## 2011-10-17 LAB — GLUCOSE, CAPILLARY: Glucose-Capillary: 122 mg/dL — ABNORMAL HIGH (ref 70–99)

## 2011-10-17 MED ORDER — FLUDEOXYGLUCOSE F - 18 (FDG) INJECTION
20.5000 | Freq: Once | INTRAVENOUS | Status: AC | PRN
Start: 2011-10-17 — End: 2011-10-17
  Administered 2011-10-17: 20.5 via INTRAVENOUS

## 2011-11-10 NOTE — Telephone Encounter (Signed)
x

## 2012-03-29 ENCOUNTER — Other Ambulatory Visit (INDEPENDENT_AMBULATORY_CARE_PROVIDER_SITE_OTHER): Payer: PRIVATE HEALTH INSURANCE

## 2012-03-29 ENCOUNTER — Ambulatory Visit (INDEPENDENT_AMBULATORY_CARE_PROVIDER_SITE_OTHER): Payer: PRIVATE HEALTH INSURANCE | Admitting: Endocrinology

## 2012-03-29 ENCOUNTER — Encounter: Payer: Self-pay | Admitting: Endocrinology

## 2012-03-29 VITALS — BP 112/72 | HR 99 | Temp 98.2°F | Ht 74.0 in | Wt 232.0 lb

## 2012-03-29 DIAGNOSIS — E890 Postprocedural hypothyroidism: Secondary | ICD-10-CM

## 2012-03-29 DIAGNOSIS — C73 Malignant neoplasm of thyroid gland: Secondary | ICD-10-CM

## 2012-03-29 LAB — BASIC METABOLIC PANEL
BUN: 13 mg/dL (ref 6–23)
CO2: 30 mEq/L (ref 19–32)
Chloride: 104 mEq/L (ref 96–112)
Creatinine, Ser: 1.3 mg/dL (ref 0.4–1.5)
Glucose, Bld: 88 mg/dL (ref 70–99)
Potassium: 4 mEq/L (ref 3.5–5.1)

## 2012-03-29 NOTE — Progress Notes (Signed)
  Subjective:    Patient ID: Jorge Ellis, male    DOB: 1965/12/06, 46 y.o.   MRN: [id number]  HPI The state of at least three ongoing medical problems is addressed today: MALIGNANT NEOPLASM OF THYROID GLAND (ICD-193)  follicular variant of papillary thyroid cancer, stage-1 5/10 thyroidectomy, T3 M1 N0  7/10 i-131 156 mci  7/10 post-therapy scan has uptake at the thyroid bed only  1/11 i-131 195 mci  1/11 post-therapy scan pos at the neck  1/11 pet scan pos for node at left supraclavicular area  5/11 thyroglobulin (on synthroid)=10.5 (ab borderline)  7/11 thyroglobilin=354 (off synthroid) (ab neg)  9/11 thyroglobulin (on synthroid)=12 (ab neg)  7/12: thyroglobulin (on synthroid)=13 (ab neg) He does not notice any nodule at the neck Postsurgical he did not tolerate tsh suppression Postsurgical hypocalcemia: she denies cramps Review of Systems Denies weight change and neck pain.    Objective:   Physical Exam VITAL SIGNS:  See vs page GENERAL: no distress Neck: a healed scar is present.  i do not appreciate a nodule in the thyroid or elsewhere in the neck  Lab Results  Component Value Date   PTH 24.8 03/29/2012   CALCIUM 9.1 03/29/2012   CALCIUM 9.1 03/29/2012   CAION 1.17 09/25/2011   Lab Results  Component Value Date   TSH 0.11* 03/29/2012   TG=15    Assessment & Plan:  Papillary adenocarcinoma of the thyroid, follicular variant.  TG is improved.  He would normally be a candidate for repeat i-131 rx.  However, due to his cumulative dosage already, it would be best to wait as long as possible. Postsurgical hypocalcemia, resolved Postsurgical hypothyroidism, well-replaced for this situation.

## 2012-03-29 NOTE — Patient Instructions (Addendum)
blood tests are being requested for you today.  You will receive a letter with results.   Please come back for a follow-up appointment in 6 months. 

## 2012-03-30 ENCOUNTER — Encounter: Payer: Self-pay | Admitting: Endocrinology

## 2012-03-31 ENCOUNTER — Telehealth: Payer: Self-pay | Admitting: *Deleted

## 2012-03-31 NOTE — Telephone Encounter (Signed)
Called pt to inform of lab results, pt's spouse informed (letter also mailed to pt).  

## 2012-05-24 ENCOUNTER — Other Ambulatory Visit: Payer: Self-pay | Admitting: Endocrinology

## 2012-09-28 ENCOUNTER — Ambulatory Visit (INDEPENDENT_AMBULATORY_CARE_PROVIDER_SITE_OTHER): Payer: PRIVATE HEALTH INSURANCE | Admitting: Endocrinology

## 2012-09-28 ENCOUNTER — Encounter: Payer: Self-pay | Admitting: Endocrinology

## 2012-09-28 VITALS — BP 124/72 | HR 80 | Temp 98.9°F | Wt 232.0 lb

## 2012-09-28 DIAGNOSIS — C73 Malignant neoplasm of thyroid gland: Secondary | ICD-10-CM

## 2012-09-28 NOTE — Patient Instructions (Addendum)
blood tests are being requested for you today.  You will receive a letter with results. Please come back for a follow-up appointment in 6 months.   Let's recheck the ultrasound.  you will receive a phone call, about a day and time for an appointment.

## 2012-09-28 NOTE — Progress Notes (Signed)
  Subjective:    Patient ID: Jorge Ellis, male    DOB: 03-05-1966, 46 y.o.   MRN: [id number]  Hypertension  The state of at least three ongoing medical problems is addressed today: MALIGNANT NEOPLASM OF THYROID GLAND (ICD-193)  follicular variant of papillary thyroid cancer, stage-1 5/10 thyroidectomy, T3 M1 N0  7/10 i-131 156 mci  7/10 post-therapy scan has uptake at the thyroid bed only  1/11 i-131 195 mci  1/11 post-therapy scan pos at the neck  1/11 pet scan pos for node at left supraclavicular area  5/11 thyroglobulin (on synthroid)=10.5 (ab borderline)  7/11 thyroglobilin=354 (off synthroid) (ab neg)  9/11 thyroglobulin (on synthroid)=12 (ab neg)  7/12: thyroglobulin (on synthroid)=13 (ab neg) 6/13: thyroglobulin (on synthroid)=15 (ab neg) He does not notice any nodule at the neck Postsurgical hypothyroidism: he did not tolerate tsh suppression Postsurgical hypocalcemia: he denies cramps Review of Systems Denies weight change and neck pain.    Objective:   Physical Exam VITAL SIGNS:  See vs page GENERAL: no distress Neck: a healed scar is present.  i do not appreciate a nodule in the thyroid or elsewhere in the neck  outside test results are reviewed: TSH=0.15 TG is unetectable    Assessment & Plan:  Papillary adenocarcinoma of the thyroid, follicular variant.  TG is improved.  He would normally be a candidate for repeat i-131 rx.  However, due to his cumulative dosage already, it would be best to wait as long as possible. Postsurgical hypocalcemia, resolved Postsurgical hypothyroidism.  He needs a slightly suppressed tsh, to reduce the probability of cancer recurrence.

## 2012-09-29 LAB — THYROGLOBULIN ANTIBODY: Thyroglobulin Ab: <20 U/mL (ref <40.0)

## 2012-10-11 ENCOUNTER — Telehealth: Payer: Self-pay

## 2012-10-11 NOTE — Telephone Encounter (Signed)
Pt's wife advised per Dr. Everardo All ultrasound was normal,see report scanned in.

## 2012-10-11 NOTE — Telephone Encounter (Signed)
noted 

## 2012-11-02 ENCOUNTER — Encounter: Payer: Self-pay | Admitting: Endocrinology

## 2013-01-26 ENCOUNTER — Other Ambulatory Visit: Payer: Self-pay | Admitting: *Deleted

## 2013-01-26 MED ORDER — LEVOTHYROXINE SODIUM 175 MCG PO TABS: 175.0000 ug | ORAL_TABLET | Freq: Every day | ORAL | Status: DC

## 2013-04-13 ENCOUNTER — Encounter: Payer: Self-pay | Admitting: Endocrinology

## 2013-04-13 ENCOUNTER — Ambulatory Visit (INDEPENDENT_AMBULATORY_CARE_PROVIDER_SITE_OTHER): Payer: PRIVATE HEALTH INSURANCE | Admitting: Endocrinology

## 2013-04-13 VITALS — BP 132/70 | HR 78 | Ht 74.0 in | Wt 232.0 lb

## 2013-04-13 DIAGNOSIS — E890 Postprocedural hypothyroidism: Secondary | ICD-10-CM

## 2013-04-13 DIAGNOSIS — C73 Malignant neoplasm of thyroid gland: Secondary | ICD-10-CM

## 2013-04-13 NOTE — Progress Notes (Signed)
Subjective:    Patient ID: Jorge Ellis, male    DOB: [birth year], 47 y.o.   MRN: [id number]  HPI The state of at least three ongoing medical problems is addressed today: MALIGNANT NEOPLASM OF THYROID GLAND (ICD-193)  follicular variant of papillary thyroid cancer, stage-1 (due to age just less than 44, at time of Dx--he otherwise would have been stage-3). 5/10 thyroidectomy, T3 N1a M0     7/10 i-131 156 mci  7/10 post-therapy scan has uptake at the thyroid bed only  1/11 i-131 195 mci  1/11 post-therapy scan pos at the neck  1/11 pet scan pos for node at left supraclavicular area  5/11 TG (on synthroid)=10.5 (ab borderline)  7/11 TG=354 (off synthroid) (ab neg)  9/11 TG (on synthroid)=12 (ab neg)  7/12: TG (on synthroid)=13 (ab neg) 6/13: TG (on synthroid)=15 (ab neg) 12/13 TG (on synthroid)=26 (ab neg) 12/13: neck US neg He does not notice any nodule at the neck.   Postsurgical hypothyroidism: he did not tolerate tsh suppression.  He says he has insomnia.  However, he say he'll reconsider now. Postsurgical hypocalcemia: he denies cramps. Past Medical History  Diagnosis Date  . Malignant neoplasm of thyroid gland 05/02/2009    follicular variant of papillary thyroid cancer 5/10 thyroidectomy, t3 M1 no 7/10 I-131 156 mci 7/10 post-therapy scan has uptake at the thryoid bed only 1/11 I-131 195 mci 1/11 post-therapy scan pos at the neck 1/11 pet scan pos for node at left supraclavicular area 5/11 thyroglobulin (on synthroid)=10.5 (ab borderline) 7/11 thyroglobulin=354 (off synthroid) (ab neg) 9/11 thyroglobulin =12 ab neg   . HYPOTHYROIDISM, POSTSURGICAL 05/02/2009  . Hypocalcemia 07/04/2010  . HYPERTENSION 05/02/2009  . PERIPHERAL EDEMA 11/12/2009  . G E R D 05/02/2009    Past Surgical History  Procedure Laterality Date  . Total thyroidectomy  Mar 02, 2009  . Uvulopalatopharyngoplasty  2005    History   Social History  . Marital Status: Married    Spouse Name: N/A    Number of  Children: N/A  . Years of Education: N/A   Occupational History  . Machinist    Social History Main Topics  . Smoking status: Never Smoker   . Smokeless tobacco: Not on file  . Alcohol Use: Not on file  . Drug Use: Not on file  . Sexually Active: Not on file   Other Topics Concern  . Not on file   Social History Narrative  . No narrative on file    Current Outpatient Prescriptions on File Prior to Visit  Medication Sig Dispense Refill  . furosemide (LASIX) 20 MG tablet 1 tablet by mouth once daily as needed for swelling       . levothyroxine (SYNTHROID, LEVOTHROID) 175 MCG tablet Take 1 tablet (175 mcg total) by mouth daily before breakfast.  30 tablet  8  . lisinopril (PRINIVIL,ZESTRIL) 20 MG tablet Take 20 mg by mouth 2 (two) times daily.        . niacin (NIASPAN) 1000 MG CR tablet Take 1,000 mg by mouth 2 (two) times daily.      Marland Kitchen omeprazole (PRILOSEC) 40 MG capsule Take 40 mg by mouth daily.         No current facility-administered medications on file prior to visit.    No Known Allergies  Family History  Problem Relation Age of Onset  . Thyroid disease Mother   . Thyroid disease Sister     had thyroidectomy (benign)   BP 132/70  Pulse  78  Ht 6\' 2"  (1.88 m)  Wt 232 lb (105.235 kg)  BMI 29.77 kg/m2  SpO2 98%  Review of Systems Denies neck pain.  He has lost a few lbs    Objective:   Physical Exam VITAL SIGNS:  See vs page GENERAL: no distress Neck: a healed scar is present.  i do not appreciate a nodule in the thyroid or elsewhere in the neck. Lab Results  Component Value Date   TSH 0.16* 04/13/2013  TG=20.    Assessment & Plan:  Differentiated thyroid cancer.  Not eradicated, but TG is at least stable.  He'll need more i-131 at some point, but we would like to wait as long as possible, to minimize effect of radiation. Postsurgical hypothyroidism.  i prefer to keep TSH slightly suppressed if possible. H/o hypocalcemia.  As he has not had this recently,  we can stop checking here.  He has annual labs with dr Reuel Boom anyway.

## 2013-04-13 NOTE — Patient Instructions (Addendum)
blood tests are being requested for you today.  We'll contact you with results. Please come back for a follow-up appointment in 6 months. At some point, you will probably need to do the radioactive iodine treatment again.  The longer we can wait, the fewer side-effects it will have.   We don't need to do the calcium check again, as the last few have been normal. You should try to take the higher amount of levothyroxine if you can, as it reduces the chances of recurrence of the cancer.

## 2013-04-15 ENCOUNTER — Telehealth: Payer: Self-pay | Admitting: Endocrinology

## 2013-04-15 NOTE — Telephone Encounter (Signed)
Please inquiring about recent bloodwork results.

## 2013-04-15 NOTE — Telephone Encounter (Signed)
Pt's wife advised.

## 2013-10-14 ENCOUNTER — Encounter: Payer: Self-pay | Admitting: Endocrinology

## 2013-10-14 ENCOUNTER — Ambulatory Visit (INDEPENDENT_AMBULATORY_CARE_PROVIDER_SITE_OTHER): Payer: PRIVATE HEALTH INSURANCE | Admitting: Endocrinology

## 2013-10-14 DIAGNOSIS — E890 Postprocedural hypothyroidism: Secondary | ICD-10-CM

## 2013-10-14 DIAGNOSIS — C73 Malignant neoplasm of thyroid gland: Secondary | ICD-10-CM

## 2013-10-14 NOTE — Progress Notes (Signed)
Subjective:    Patient ID: Jorge Ellis, male    DOB: August 27, 1966, 47 y.o.   MRN: [id number]  HPI MALIGNANT NEOPLASM OF THYROID GLAND (ICD-193)  follicular variant of papillary thyroid cancer, stage-1 (due to age just less than 44, at time of Dx--he otherwise would have been stage-3). 5/10 thyroidectomy, T3 N1a M0     7/10 i-131 156 mci  7/10 post-therapy scan has uptake at the thyroid bed only  1/11 i-131 195 mci  1/11 post-therapy scan pos at the neck  1/11 pet scan pos for node at left supraclavicular area  5/11 TG (on synthroid)=10.5 (ab borderline)  7/11 TG=354 (off synthroid) (ab neg)  9/11 TG (on synthroid)=12 (ab neg)  7/12: TG (on synthroid)=13 (ab neg) 6/13: TG (on synthroid)=15 (ab neg) 12/13 TG (on synthroid)=26 (ab neg) 12/13: neck US neg 6/14: TG (on synthroid)=20 (ab neg) He does not notice any nodule at the neck.   Postsurgical hypothyroidism: he resumed suppressive synthroid dosage in mid-2014.  Denies palpitations in the chest, or assoc sob Past Medical History  Diagnosis Date  . Malignant neoplasm of thyroid gland 05/02/2009    follicular variant of papillary thyroid cancer 5/10 thyroidectomy, t3 M1 no 7/10 I-131 156 mci 7/10 post-therapy scan has uptake at the thryoid bed only 1/11 I-131 195 mci 1/11 post-therapy scan pos at the neck 1/11 pet scan pos for node at left supraclavicular area 5/11 thyroglobulin (on synthroid)=10.5 (ab borderline) 7/11 thyroglobulin=354 (off synthroid) (ab neg) 9/11 thyroglobulin =12 ab neg   . HYPOTHYROIDISM, POSTSURGICAL 05/02/2009  . Hypocalcemia 07/04/2010  . HYPERTENSION 05/02/2009  . PERIPHERAL EDEMA 11/12/2009  . G E R D 05/02/2009    Past Surgical History  Procedure Laterality Date  . Total thyroidectomy  Mar 02, 2009  . Uvulopalatopharyngoplasty  2005    History   Social History  . Marital Status: Married    Spouse Name: N/A    Number of Children: N/A  . Years of Education: N/A   Occupational History  . Machinist     Social History Main Topics  . Smoking status: Never Smoker   . Smokeless tobacco: Not on file  . Alcohol Use: Not on file  . Drug Use: Not on file  . Sexual Activity: Not on file   Other Topics Concern  . Not on file   Social History Narrative  . No narrative on file    Current Outpatient Prescriptions on File Prior to Visit  Medication Sig Dispense Refill  . furosemide (LASIX) 20 MG tablet 1 tablet by mouth once daily as needed for swelling       . levothyroxine (SYNTHROID, LEVOTHROID) 175 MCG tablet Take 1 tablet (175 mcg total) by mouth daily before breakfast.  30 tablet  8  . lisinopril (PRINIVIL,ZESTRIL) 20 MG tablet Take 20 mg by mouth 2 (two) times daily.        . niacin (NIASPAN) 1000 MG CR tablet Take 1,000 mg by mouth 2 (two) times daily.      Marland Kitchen omeprazole (PRILOSEC) 40 MG capsule Take 40 mg by mouth daily.         No current facility-administered medications on file prior to visit.    No Known Allergies  Family History  Problem Relation Age of Onset  . Thyroid disease Mother   . Thyroid disease Sister     had thyroidectomy (benign)    BP 130/84  Pulse 93  Temp(Src) 98.3 F (36.8 C) (Oral)  Ht 6\' 2"  (  1.88 m)  Wt 241 lb (109.317 kg)  BMI 30.93 kg/m2  SpO2 95%  Review of Systems He has gained a few lbs.  Denies edema.    Objective:   Physical Exam VITAL SIGNS:  See vs page GENERAL: no distress Neck: a healed scar is present.  i do not appreciate a nodule in the thyroid or elsewhere in the neck     Assessment & Plan:  Differentiated thyroid cancer.  Not eradicated, but TG is at least stable.  He'll need more i-131 at some point, but we would like to wait as long as possible, to minimize effect of radiation. Postsurgical hypothyroidism.  i prefer to keep TSH slightly suppressed if possible. Weight gain: not thyroid-related

## 2013-10-14 NOTE — Patient Instructions (Signed)
blood tests are being requested for you today.  We'll contact you with results. Please come back for a follow-up appointment in 6 months. At some point, you will probably need to do the radioactive iodine treatment again.  The longer we can wait, the fewer side-effects it will have.   You should try to continue the higher amount of levothyroxine if you can, as it reduces the chances of recurrence of the cancer.

## 2013-10-18 LAB — THYROGLOBULIN LEVEL: Thyroglobulin: 25.3 ng/mL (ref 0.0–55.0)

## 2013-10-18 LAB — THYROGLOBULIN ANTIBODY: Thyroglobulin Ab: <20 U/mL (ref <40.0)

## 2014-01-23 ENCOUNTER — Other Ambulatory Visit: Payer: Self-pay | Admitting: Endocrinology

## 2014-04-07 ENCOUNTER — Encounter: Payer: Self-pay | Admitting: Endocrinology

## 2014-04-07 ENCOUNTER — Ambulatory Visit (INDEPENDENT_AMBULATORY_CARE_PROVIDER_SITE_OTHER): Payer: PRIVATE HEALTH INSURANCE | Admitting: Endocrinology

## 2014-04-07 VITALS — BP 122/78 | HR 88 | Temp 98.1°F | Ht 74.0 in | Wt 239.0 lb

## 2014-04-07 DIAGNOSIS — E890 Postprocedural hypothyroidism: Secondary | ICD-10-CM

## 2014-04-07 DIAGNOSIS — C73 Malignant neoplasm of thyroid gland: Secondary | ICD-10-CM

## 2014-04-07 NOTE — Progress Notes (Signed)
Subjective:    Patient ID: Jorge Ellis, male    DOB: May 14, 1966, 48 y.o.   MRN: [id number]  HPI Pt returns for MALIGNANT NEOPLASM OF THYROID GLAND (FXT-024)  follicular variant of papillary thyroid cancer, stage-1 (due to age just less than 23, at time of Dx--he otherwise would have been stage-3). 5/10 thyroidectomy, T3 N1a M0     7/10 i-131 156 mci  7/10 post-therapy scan has uptake at the thyroid bed only  1/11 i-131 195 mci  1/11 post-therapy scan pos at the neck  1/11 pet scan pos for node at left supraclavicular area  5/11 TG (on synthroid)=10.5 (ab borderline)  7/11 TG=354 (off synthroid) (ab neg)  9/11 TG (on synthroid)=12 (ab neg)  7/12: TG (on synthroid)=13 (ab neg) 6/13: TG (on synthroid)=15 (ab neg) 12/13 TG (on synthroid)=26 (ab neg) 12/13: neck US neg 6/14: TG (on synthroid)=20 (ab neg) He does not notice any nodule at the neck.   Postsurgical hypothyroidism: he resumed suppressive synthroid dosage in mid-2014.  He has little if any palpitations in the chest, or assoc sob.   Past Medical History  Diagnosis Date  . Malignant neoplasm of thyroid gland 0/06/7352    follicular variant of papillary thyroid cancer 5/10 thyroidectomy, t3 M1 no 7/10 I-131 156 mci 7/10 post-therapy scan has uptake at the thryoid bed only 1/11 I-131 195 mci 1/11 post-therapy scan pos at the neck 1/11 pet scan pos for node at left supraclavicular area 5/11 thyroglobulin (on synthroid)=10.5 (ab borderline) 7/11 thyroglobulin=354 (off synthroid) (ab neg) 9/11 thyroglobulin =12 ab neg   . HYPOTHYROIDISM, POSTSURGICAL 05/02/2009  . Hypocalcemia 07/04/2010  . HYPERTENSION 05/02/2009  . PERIPHERAL EDEMA 11/12/2009  . G E R D 05/02/2009    Past Surgical History  Procedure Laterality Date  . Total thyroidectomy  Mar 02, 2009  . Uvulopalatopharyngoplasty  2005    History   Social History  . Marital Status: Married    Spouse Name: N/A    Number of Children: N/A  . Years of Education: N/A    Occupational History  . Machinist    Social History Main Topics  . Smoking status: Never Smoker   . Smokeless tobacco: Not on file  . Alcohol Use: Not on file  . Drug Use: Not on file  . Sexual Activity: Not on file   Other Topics Concern  . Not on file   Social History Narrative  . No narrative on file    Current Outpatient Prescriptions on File Prior to Visit  Medication Sig Dispense Refill  . furosemide (LASIX) 20 MG tablet 1 tablet by mouth once daily as needed for swelling       . levothyroxine (SYNTHROID, LEVOTHROID) 175 MCG tablet TAKE ONE TABLET BY MOUTH ONCE DAILY BEFORE BREAKFAST  30 tablet  2  . lisinopril (PRINIVIL,ZESTRIL) 20 MG tablet Take 20 mg by mouth 2 (two) times daily.        . niacin (NIASPAN) 1000 MG CR tablet Take 1,000 mg by mouth 2 (two) times daily.      Marland Kitchen omeprazole (PRILOSEC) 40 MG capsule Take 40 mg by mouth daily.         No current facility-administered medications on file prior to visit.    No Known Allergies  Family History  Problem Relation Age of Onset  . Thyroid disease Mother   . Thyroid disease Sister     had thyroidectomy (benign)    BP 122/78  Pulse 88  Temp(Src) 98.1 F (  36.7 C) (Oral)  Ht 6\' 2"  (1.88 m)  Wt 239 lb (108.41 kg)  BMI 30.67 kg/m2  SpO2 94%  Review of Systems Denies weight change and neck pain    Objective:   Physical Exam VITAL SIGNS:  See vs page GENERAL: no distress Neck: a healed scar is present.  i do not appreciate a nodule in the thyroid or elsewhere in the neck   outside test results are reviewed: TSH=0.09    Assessment & Plan:  Thyroid cancer: no clinical evidence of recurrence, but he has a persistently elevated TG. Postsurgical hypothyroidism: borderline for decreasing synthroid, or continuing same.   Hypocalcemia: resolved.      Patient is advised the following: Patient Instructions  We don't need to check the calcium here any more.  You can have it done at your annual checkup  with Dr Quillian Quince. blood tests are being requested for you today.  We'll contact you with results.   Please come back for a follow-up appointment in 6 months.

## 2014-04-07 NOTE — Patient Instructions (Addendum)
We don't need to check the calcium here any more.  You can have it done at your annual checkup with Dr Quillian Quince. blood tests are being requested for you today.  We'll contact you with results.   Please come back for a follow-up appointment in 6 months.

## 2014-04-10 LAB — THYROGLOBULIN LEVEL: THYROGLOBULIN: 19.5 ng/mL (ref 0.0–55.0)

## 2014-04-10 LAB — TSH: TSH: 0.35 u[IU]/mL (ref 0.35–4.50)

## 2014-04-10 LAB — THYROGLOBULIN ANTIBODY: Thyroglobulin Ab: <20 IU/mL (ref <40.0)

## 2014-04-12 ENCOUNTER — Telehealth: Payer: Self-pay | Admitting: Endocrinology

## 2014-04-12 NOTE — Telephone Encounter (Signed)
Pt request lab result that was done on 04/07/14. Pt also request copy of the result if this is ok. Pt is aware that [name] is on vacation, Please call pt

## 2014-04-12 NOTE — Telephone Encounter (Signed)
Returned pt's call. Advised him of his lab results. He asked for his results to be mailed to him. Mailed today.

## 2014-04-17 ENCOUNTER — Ambulatory Visit: Payer: PRIVATE HEALTH INSURANCE | Admitting: Endocrinology

## 2014-04-28 ENCOUNTER — Other Ambulatory Visit: Payer: Self-pay | Admitting: Endocrinology

## 2014-05-30 ENCOUNTER — Telehealth: Payer: Self-pay | Admitting: Endocrinology

## 2014-05-30 ENCOUNTER — Other Ambulatory Visit: Payer: Self-pay | Admitting: Endocrinology

## 2014-05-30 NOTE — Telephone Encounter (Signed)
levothyroxin needs to be called in

## 2014-05-30 NOTE — Telephone Encounter (Signed)
Rx sent electronically.  

## 2014-06-29 ENCOUNTER — Other Ambulatory Visit: Payer: Self-pay | Admitting: Endocrinology

## 2014-09-07 ENCOUNTER — Encounter (INDEPENDENT_AMBULATORY_CARE_PROVIDER_SITE_OTHER): Payer: Self-pay | Admitting: *Deleted

## 2014-10-02 ENCOUNTER — Ambulatory Visit: Payer: PRIVATE HEALTH INSURANCE | Admitting: Endocrinology

## 2014-10-03 ENCOUNTER — Ambulatory Visit (INDEPENDENT_AMBULATORY_CARE_PROVIDER_SITE_OTHER): Payer: PRIVATE HEALTH INSURANCE | Admitting: Endocrinology

## 2014-10-03 ENCOUNTER — Encounter: Payer: Self-pay | Admitting: Endocrinology

## 2014-10-03 VITALS — BP 124/84 | HR 104 | Temp 98.6°F | Ht 74.0 in | Wt 240.0 lb

## 2014-10-03 DIAGNOSIS — E890 Postprocedural hypothyroidism: Secondary | ICD-10-CM

## 2014-10-03 DIAGNOSIS — C73 Malignant neoplasm of thyroid gland: Secondary | ICD-10-CM

## 2014-10-03 DIAGNOSIS — D696 Thrombocytopenia, unspecified: Secondary | ICD-10-CM

## 2014-10-03 NOTE — Patient Instructions (Addendum)
blood tests are being requested for you today.  We'll let you know about the results.   Please come back for a follow-up appointment in 6 months.   Please stop taking the thyroid pill 10-14 days prior to the appointment.

## 2014-10-03 NOTE — Progress Notes (Signed)
Subjective:    Patient ID: Jorge Ellis, male    DOB: 05-05-1966, 48 y.o.   MRN: [id number]  HPI Pt returns for MALIGNANT NEOPLASM OF THYROID GLAND (IWP-809)  follicular variant of papillary thyroid cancer, stage-1 (due to age just less than 59, at time of Dx--he otherwise would have been stage-3). 5/10 thyroidectomy, T3 N1a M0     7/10 i-131 156 mci  7/10 post-therapy scan has uptake at the thyroid bed only  12/10: TG (off synthroid)=296 (ab neg) 1/11 i-131 195 mci  1/11 post-therapy scan pos at the neck  1/11 PET CT pos for node at left supraclavicular area  5/11 TG (on synthroid)=10.5 (ab borderline)  7/11 TG=354 (off synthroid) (ab neg)  9/11 TG (on synthroid)=12 (ab neg)  7/12: TG (on synthroid)=13 (ab neg) 12/12: PET CT: neg 6/13: TG (on synthroid)=15 (ab neg) 12/13 TG (on synthroid)=26 (ab neg) 12/13: neck US neg 6/14: TG (on synthroid)=20 (ab neg) 12/14: TG(on synthroid)=25 (ab neg) 6/15: TG (on synthroid)=20 (ab neg) He does not notice any nodule at the neck.   Postsurgical hypothyroidism: denies weight change.   Pt says PLTS were noted to be slightly low a few mos ago.   Past Medical History  Diagnosis Date  . Malignant neoplasm of thyroid gland 07/04/3381    follicular variant of papillary thyroid cancer 5/10 thyroidectomy, t3 M1 no 7/10 I-131 156 mci 7/10 post-therapy scan has uptake at the thryoid bed only 1/11 I-131 195 mci 1/11 post-therapy scan pos at the neck 1/11 pet scan pos for node at left supraclavicular area 5/11 thyroglobulin (on synthroid)=10.5 (ab borderline) 7/11 thyroglobulin=354 (off synthroid) (ab neg) 9/11 thyroglobulin =12 ab neg   . HYPOTHYROIDISM, POSTSURGICAL 05/02/2009  . Hypocalcemia 07/04/2010  . HYPERTENSION 05/02/2009  . PERIPHERAL EDEMA 11/12/2009  . G E R D 05/02/2009    Past Surgical History  Procedure Laterality Date  . Total thyroidectomy  Mar 02, 2009  . Uvulopalatopharyngoplasty  2005    History   Social History  . Marital Status:  Married    Spouse Name: N/A    Number of Children: N/A  . Years of Education: N/A   Occupational History  . Machinist    Social History Main Topics  . Smoking status: Never Smoker   . Smokeless tobacco: Not on file  . Alcohol Use: Not on file  . Drug Use: Not on file  . Sexual Activity: Not on file   Other Topics Concern  . Not on file   Social History Narrative    Current Outpatient Prescriptions on File Prior to Visit  Medication Sig Dispense Refill  . furosemide (LASIX) 20 MG tablet 1 tablet by mouth once daily as needed for swelling     . lisinopril (PRINIVIL,ZESTRIL) 20 MG tablet Take 20 mg by mouth 2 (two) times daily.      . niacin (NIASPAN) 1000 MG CR tablet Take 1,000 mg by mouth 2 (two) times daily.    Marland Kitchen omeprazole (PRILOSEC) 40 MG capsule Take 40 mg by mouth daily.       No current facility-administered medications on file prior to visit.    No Known Allergies  Family History  Problem Relation Age of Onset  . Thyroid disease Mother   . Thyroid disease Sister     had thyroidectomy (benign)    BP 124/84 mmHg  Pulse 104  Temp(Src) 98.6 F (37 C) (Oral)  Ht 6\' 2"  (1.88 m)  Wt 240 lb (108.863 kg)  BMI 30.80 kg/m2  SpO2 96%  Review of Systems Hr reports mild insomnia.  Denies fatigue.     Objective:   Physical Exam VITAL SIGNS:  See vs page GENERAL: no distress Neck: a healed scar is present.  i do not appreciate a nodule in the thyroid or elsewhere in the neck.     TG=25 Lab Results  Component Value Date   TSH 1.28 10/03/2014   Lab Results  Component Value Date   WBC 4.6 10/03/2014   HGB 15.0 10/03/2014   HCT 44.7 10/03/2014   MCV 91.7 10/03/2014   PLT 160.0 10/03/2014      Assessment & Plan:  Differentiated thyroid cancer.  Mild exacerbation, based on TG.  Pt should have imaging again.  We discussed thyrogen vs synthroid withdrawal.  He wants to do withdrawal, but also wants to wait until the summer.   Postsurgical hypothyroidism: he  should have a suppressed TSH, so we are increasing synthroid. Thrombocytopenia: much better.  unlikely due to I-131  Patient is advised the following: Patient Instructions  blood tests are being requested for you today.  We'll let you know about the results.   Please come back for a follow-up appointment in 6 months.   Please stop taking the thyroid pill 10-14 days prior to the appointment.

## 2014-10-04 LAB — CBC WITH DIFFERENTIAL/PLATELET
Basophils Absolute: 0 10*3/uL (ref 0.0–0.1)
Basophils Relative: 0.4 % (ref 0.0–3.0)
EOS PCT: 1.3 % (ref 0.0–5.0)
Eosinophils Absolute: 0.1 10*3/uL (ref 0.0–0.7)
HEMATOCRIT: 44.7 % (ref 39.0–52.0)
Hemoglobin: 15 g/dL (ref 13.0–17.0)
LYMPHS ABS: 1.8 10*3/uL (ref 0.7–4.0)
Lymphocytes Relative: 38.8 % (ref 12.0–46.0)
MCHC: 33.6 g/dL (ref 30.0–36.0)
MCV: 91.7 fl (ref 78.0–100.0)
MONO ABS: 0.4 10*3/uL (ref 0.1–1.0)
MONOS PCT: 9.1 % (ref 3.0–12.0)
NEUTROS PCT: 50.4 % (ref 43.0–77.0)
Neutro Abs: 2.3 10*3/uL (ref 1.4–7.7)
PLATELETS: 160 10*3/uL (ref 150.0–400.0)
RBC: 4.88 Mil/uL (ref 4.22–5.81)
RDW: 13.2 % (ref 11.5–15.5)
WBC: 4.6 10*3/uL (ref 4.0–10.5)

## 2014-10-04 LAB — TSH: TSH: 1.28 u[IU]/mL (ref 0.35–4.50)

## 2014-10-04 MED ORDER — LEVOTHYROXINE SODIUM 200 MCG PO TABS: 200.0000 ug | ORAL_TABLET | Freq: Every day | ORAL | Status: DC

## 2014-10-05 LAB — THYROGLOBULIN LEVEL: THYROGLOBULIN: 25.8 ng/mL (ref 2.8–40.9)

## 2014-10-05 LAB — THYROGLOBULIN ANTIBODY

## 2014-11-07 ENCOUNTER — Other Ambulatory Visit (INDEPENDENT_AMBULATORY_CARE_PROVIDER_SITE_OTHER): Payer: Self-pay | Admitting: *Deleted

## 2014-11-07 ENCOUNTER — Encounter (INDEPENDENT_AMBULATORY_CARE_PROVIDER_SITE_OTHER): Payer: Self-pay | Admitting: *Deleted

## 2014-11-07 DIAGNOSIS — Z1211 Encounter for screening for malignant neoplasm of colon: Secondary | ICD-10-CM

## 2014-11-27 ENCOUNTER — Telehealth (INDEPENDENT_AMBULATORY_CARE_PROVIDER_SITE_OTHER): Payer: Self-pay | Admitting: *Deleted

## 2014-11-27 DIAGNOSIS — Z1211 Encounter for screening for malignant neoplasm of colon: Secondary | ICD-10-CM

## 2014-11-27 NOTE — Telephone Encounter (Signed)
Patient needs movi prep 

## 2014-11-30 ENCOUNTER — Telehealth (INDEPENDENT_AMBULATORY_CARE_PROVIDER_SITE_OTHER): Payer: Self-pay | Admitting: *Deleted

## 2014-11-30 MED ORDER — PEG-KCL-NACL-NASULF-NA ASC-C 100 G PO SOLR: 1.0000 | Freq: Once | ORAL | Status: DC

## 2014-11-30 NOTE — Telephone Encounter (Signed)
agree

## 2014-11-30 NOTE — Telephone Encounter (Signed)
Referring MD/PCP: daniel   Procedure: tcs  Reason/Indication:  screening  Has patient had this procedure before?  Yes, 10 years ago  If so, when, by whom and where?    Is there a family history of colon cancer?  Not sure  Who?  What age when diagnosed?    Is patient diabetic?   no      Does patient have prosthetic heart valve?  no  Do you have a pacemaker?  no  Has patient ever had endocarditis? no  Has patient had joint replacement within last 12 months?  no  Does patient tend to be constipated or take laxatives? no  Is patient on Coumadin, Plavix and/or Aspirin? no  Medications: levothyroxine 175 mcg daily, omeprazole 40 mg daily, vitamins, lisinopril 20 mg daily  Allergies: nkda  Medication Adjustment:   Procedure date & time: 12/27/14 at 930

## 2014-12-27 ENCOUNTER — Encounter (HOSPITAL_COMMUNITY)
Admission: RE | Disposition: A | Discharge status: Home / Self Care | Payer: Self-pay | Source: Ambulatory Visit | Attending: Internal Medicine

## 2014-12-27 ENCOUNTER — Encounter (HOSPITAL_COMMUNITY): Payer: Self-pay | Admitting: *Deleted

## 2014-12-27 ENCOUNTER — Ambulatory Visit (HOSPITAL_COMMUNITY)
Admission: RE | Admit: 2014-12-27 | Discharge: 2014-12-27 | Disposition: A | Discharge status: Home / Self Care | Payer: 59 | Source: Ambulatory Visit | Attending: Internal Medicine | Admitting: Internal Medicine

## 2014-12-27 DIAGNOSIS — K649 Unspecified hemorrhoids: Secondary | ICD-10-CM

## 2014-12-27 DIAGNOSIS — K5730 Diverticulosis of large intestine without perforation or abscess without bleeding: Secondary | ICD-10-CM | POA: Diagnosis not present

## 2014-12-27 DIAGNOSIS — Z85850 Personal history of malignant neoplasm of thyroid: Secondary | ICD-10-CM | POA: Diagnosis not present

## 2014-12-27 DIAGNOSIS — I10 Essential (primary) hypertension: Secondary | ICD-10-CM | POA: Insufficient documentation

## 2014-12-27 DIAGNOSIS — E780 Pure hypercholesterolemia: Secondary | ICD-10-CM | POA: Insufficient documentation

## 2014-12-27 DIAGNOSIS — D123 Benign neoplasm of transverse colon: Secondary | ICD-10-CM

## 2014-12-27 DIAGNOSIS — K644 Residual hemorrhoidal skin tags: Secondary | ICD-10-CM | POA: Diagnosis not present

## 2014-12-27 DIAGNOSIS — Z1211 Encounter for screening for malignant neoplasm of colon: Secondary | ICD-10-CM

## 2014-12-27 DIAGNOSIS — E039 Hypothyroidism, unspecified: Secondary | ICD-10-CM | POA: Insufficient documentation

## 2014-12-27 DIAGNOSIS — K219 Gastro-esophageal reflux disease without esophagitis: Secondary | ICD-10-CM | POA: Insufficient documentation

## 2014-12-27 DIAGNOSIS — G4730 Sleep apnea, unspecified: Secondary | ICD-10-CM | POA: Insufficient documentation

## 2014-12-27 DIAGNOSIS — Z79899 Other long term (current) drug therapy: Secondary | ICD-10-CM | POA: Diagnosis not present

## 2014-12-27 HISTORY — DX: Pure hypercholesterolemia, unspecified: E78.00

## 2014-12-27 HISTORY — PX: COLONOSCOPY: SHX5424

## 2014-12-27 HISTORY — DX: Sleep apnea, unspecified: G47.30

## 2014-12-27 SURGERY — COLONOSCOPY
Anesthesia: Moderate Sedation

## 2014-12-27 MED ORDER — MIDAZOLAM HCL 5 MG/5ML IJ SOLN
INTRAMUSCULAR | Status: DC | PRN
  Administered 2014-12-27: 1 mg via INTRAVENOUS
  Administered 2014-12-27 (×3): 2 mg via INTRAVENOUS

## 2014-12-27 MED ORDER — MEPERIDINE HCL 50 MG/ML IJ SOLN
INTRAMUSCULAR | Status: AC
  Filled 2014-12-27: qty 1

## 2014-12-27 MED ORDER — MIDAZOLAM HCL 5 MG/5ML IJ SOLN
INTRAMUSCULAR | Status: AC
  Filled 2014-12-27: qty 10

## 2014-12-27 MED ORDER — STERILE WATER FOR IRRIGATION IR SOLN
Status: DC | PRN
  Administered 2014-12-27: 09:00:00

## 2014-12-27 MED ORDER — MEPERIDINE HCL 50 MG/ML IJ SOLN
INTRAMUSCULAR | Status: DC | PRN
  Administered 2014-12-27 (×2): 25 mg via INTRAVENOUS

## 2014-12-27 MED ORDER — SODIUM CHLORIDE 0.9 % IV SOLN
INTRAVENOUS | Status: DC
  Administered 2014-12-27: 09:00:00 via INTRAVENOUS

## 2014-12-27 NOTE — Op Note (Signed)
COLONOSCOPY PROCEDURE REPORT  PATIENT:  Jorge Ellis  MR#:  [id number] Birthdate:  1966/04/06, 49 y.o., male Endoscopist:  Dr. Rogene Houston, MD Referred By:  Dr. Gar Ponto, MD  Procedure Date: 12/27/2014  Procedure:   Colonoscopy  Indications:  Patient is 49 year old Caucasian male who is undergoing average risk screening colonoscopy. Last colonoscopy was 10 years ago for hematochezia secondary to hemorrhoids. Personal history significant for history of thyroid carcinoma and he remains in remission.  Informed Consent:  The procedure and risks were reviewed with the patient and informed consent was obtained.  Medications:  Demerol 50 mg IV Versed 7 mg IV  Description of procedure:  After a digital rectal exam was performed, that colonoscope was advanced from the anus through the rectum and colon to the area of the cecum, ileocecal valve and appendiceal orifice. The cecum was deeply intubated. These structures were well-seen and photographed for the record. From the level of the cecum and ileocecal valve, the scope was slowly and cautiously withdrawn. The mucosal surfaces were carefully surveyed utilizing scope tip to flexion to facilitate fold flattening as needed. The scope was pulled down into the rectum where a thorough exam including retroflexion was performed.  Findings:   Prep excellent. 3 mm polyp ablated via cold biopsy from hepatic flexure. Single small diverticulum at splenic flexure. Normal rectal mucosa. Small hemorrhoids below the dentate line.   Therapeutic/Diagnostic Maneuvers Performed:  See above  Complications:  None  Cecal Withdrawal Time:  11 minutes  Impression:  Examination performed to cecum. 3 mm polyp ablated via cold biopsy from hepatic flexure. Single small diverticulum at splenic flexure. Small external hemorrhoids.  Recommendations:  Standard instructions given. I will contact patient with biopsy results and further  recommendations.  Decklyn Hyder U  12/27/2014 9:29 AM  CC: Dr. Gar Ponto, MD & Dr. Rayne Du ref. provider found

## 2014-12-27 NOTE — H&P (Signed)
Jorge Ellis is an 49 y.o. male.   Chief Complaint: Patient is here for colonoscopy. HPI: Patient is 49 year old Caucasian male who is here for screening colonoscopy. He denies change in bowel habits rectal bleeding abdominal pain. Last colonoscopy was 10 years ago for hematochezia secondary to hemorrhoids. Personal history significant for thyroid carcinoma and is remains in remission. Family history is negative for CRC.  Past Medical History  Diagnosis Date  . Malignant neoplasm of thyroid gland 0/10/270    follicular variant of papillary thyroid cancer 5/10 thyroidectomy, t3 M1 no 7/10 I-131 156 mci 7/10 post-therapy scan has uptake at the thryoid bed only 1/11 I-131 195 mci 1/11 post-therapy scan pos at the neck 1/11 pet scan pos for node at left supraclavicular area 5/11 thyroglobulin (on synthroid)=10.5 (ab borderline) 7/11 thyroglobulin=354 (off synthroid) (ab neg) 9/11 thyroglobulin =12 ab neg   . HYPOTHYROIDISM, POSTSURGICAL 05/02/2009  . Hypocalcemia 07/04/2010  . HYPERTENSION 05/02/2009  . PERIPHERAL EDEMA 11/12/2009  . G E R D 05/02/2009  . Sleep apnea   . Hypercholesteremia     Past Surgical History  Procedure Laterality Date  . Total thyroidectomy  Mar 02, 2009  . Uvulopalatopharyngoplasty  2005  . Colonoscopy      Family History  Problem Relation Age of Onset  . Thyroid disease Mother   . Thyroid disease Sister     had thyroidectomy (benign)   Social History:  reports that he has never smoked. He does not have any smokeless tobacco history on file. He reports that he does not drink alcohol or use illicit drugs.  Allergies: No Known Allergies  Medications Prior to Admission  Medication Sig Dispense Refill  . Cholecalciferol (VITAMIN D3) 5000 UNITS CAPS Take 1 tablet by mouth daily.    . ferrous sulfate 325 (65 FE) MG tablet Take 325 mg by mouth daily with breakfast.    . Ginkgo Biloba (GINKOBA PO) Take 2 tablets by mouth daily.    Marland Kitchen levothyroxine (SYNTHROID, LEVOTHROID)  200 MCG tablet Take 1 tablet (200 mcg total) by mouth daily before breakfast. 30 tablet 11  . lisinopril (PRINIVIL,ZESTRIL) 20 MG tablet Take 40 mg by mouth daily.     . niacin (NIASPAN) 1000 MG CR tablet Take 1,000 mg by mouth 2 (two) times daily.    Marland Kitchen omeprazole (PRILOSEC) 40 MG capsule Take 40 mg by mouth daily.      . peg 3350 powder (MOVIPREP) 100 G SOLR Take 1 kit (200 g total) by mouth once. 1 kit 0  . vitamin B-12 (CYANOCOBALAMIN) 1000 MCG tablet Take 1,000 mcg by mouth daily.    . Vitamin K, Phytonadione, 100 MCG TABS Take 1 tablet by mouth daily.    . furosemide (LASIX) 20 MG tablet 1 tablet by mouth once daily as needed for swelling       No results found for this or any previous visit (from the past 48 hour(s)). No results found.  ROS  Blood pressure 126/80, pulse 71, temperature 98.5 F (36.9 C), temperature source Oral, resp. rate 19, height 6' 2" (1.88 m), weight 240 lb (108.863 kg), SpO2 99 %. Physical Exam  Constitutional: He appears well-developed and well-nourished.  HENT:  Mouth/Throat: Oropharynx is clear and moist.  Eyes: Conjunctivae are normal. No scleral icterus.  Neck: No thyromegaly present.  Low anterior neck scar  Cardiovascular: Normal rate and regular rhythm.   No murmur heard. Respiratory: Effort normal.  GI: Soft. He exhibits no distension and no mass. There is  no tenderness.  Musculoskeletal: He exhibits no edema.  Lymphadenopathy:    He has no cervical adenopathy.  Neurological: He is alert.  Skin: Skin is warm and dry.     Assessment/Plan Average risk screening colonoscopy.  REHMAN,NAJEEB U 12/27/2014, 8:47 AM

## 2014-12-27 NOTE — Discharge Instructions (Signed)
Resume usual medications and diet. No driving for 24 hours. Physician will call with biopsy results.  Colonoscopy, Care After These instructions give you information on caring for yourself after your procedure. Your doctor may also give you more specific instructions. Call your doctor if you have any problems or questions after your procedure. HOME CARE  Do not drive for 24 hours.  Do not sign important papers or use machinery for 24 hours.  You may shower.  You may go back to your usual activities, but go slower for the first 24 hours.  Take rest breaks often during the first 24 hours.  Walk around or use warm packs on your belly (abdomen) if you have belly cramping or gas.  Drink enough fluids to keep your pee (urine) clear or pale yellow.  Resume your normal diet. Avoid heavy or fried foods.  Avoid drinking alcohol for 24 hours or as told by your doctor.  Only take medicines as told by your doctor. If a tissue sample (biopsy) was taken during the procedure:   Do not take aspirin or blood thinners for 7 days, or as told by your doctor.  Do not drink alcohol for 7 days, or as told by your doctor.  Eat soft foods for the first 24 hours. GET HELP IF: You still have a small amount of blood in your poop (stool) 2-3 days after the procedure. GET HELP RIGHT AWAY IF:  You have more than a small amount of blood in your poop.  You see clumps of tissue (blood clots) in your poop.  Your belly is puffy (swollen).  You feel sick to your stomach (nauseous) or throw up (vomit).  You have a fever.  You have belly pain that gets worse and medicine does not help. MAKE SURE YOU:  Understand these instructions.  Will watch your condition.  Will get help right away if you are not doing well or get worse. Document Released: 11/15/2010 Document Revised: 10/18/2013 Document Reviewed: 06/20/2013 [facility] Patient Information 2015 [place], [place]. This information is not intended to  replace advice given to you by your health care provider. Make sure you discuss any questions you have with your health care provider.   Colon Polyps Polyps are lumps of extra tissue growing inside the body. Polyps can grow in the large intestine (colon). Most colon polyps are noncancerous (benign). However, some colon polyps can become cancerous over time. Polyps that are larger than a pea may be harmful. To be safe, caregivers remove and test all polyps. CAUSES  Polyps form when mutations in the genes cause your cells to grow and divide even though no more tissue is needed. RISK FACTORS There are a number of risk factors that can increase your chances of getting colon polyps. They include:  Being older than 50 years.  Family history of colon polyps or colon cancer.  Long-term colon diseases, such as colitis or Crohn disease.  Being overweight.  Smoking.  Being inactive.  Drinking too much alcohol. SYMPTOMS  Most small polyps do not cause symptoms. If symptoms are present, they may include:  Blood in the stool. The stool may look dark red or black.  Constipation or diarrhea that lasts longer than 1 week. DIAGNOSIS People often do not know they have polyps until their caregiver finds them during a regular checkup. Your caregiver can use 4 tests to check for polyps:  Digital rectal exam. The caregiver wears gloves and feels inside the rectum. This test would find polyps only  in the rectum.  Barium enema. The caregiver puts a liquid called barium into your rectum before taking X-rays of your colon. Barium makes your colon look white. Polyps are dark, so they are easy to see in the X-ray pictures.  Sigmoidoscopy. A thin, flexible tube (sigmoidoscope) is placed into your rectum. The sigmoidoscope has a light and tiny camera in it. The caregiver uses the sigmoidoscope to look at the last third of your colon.  Colonoscopy. This test is like sigmoidoscopy, but the caregiver looks at  the entire colon. This is the most common method for finding and removing polyps. TREATMENT  Any polyps will be removed during a sigmoidoscopy or colonoscopy. The polyps are then tested for cancer. PREVENTION  To help lower your risk of getting more colon polyps:  Eat plenty of fruits and vegetables. Avoid eating fatty foods.  Do not smoke.  Avoid drinking alcohol.  Exercise every day.  Lose weight if recommended by your caregiver.  Eat plenty of calcium and folate. Foods that are rich in calcium include milk, cheese, and broccoli. Foods that are rich in folate include chickpeas, kidney beans, and spinach. HOME CARE INSTRUCTIONS Keep all follow-up appointments as directed by your caregiver. You may need periodic exams to check for polyps. SEEK MEDICAL CARE IF: You notice bleeding during a bowel movement. Document Released: 07/09/2004 Document Revised: 01/05/2012 Document Reviewed: 12/23/2011 [facility] Patient Information 2015 [place], [place]. This information is not intended to replace advice given to you by your health care provider. Make sure you discuss any questions you have with your health care provider.

## 2014-12-28 ENCOUNTER — Encounter (HOSPITAL_COMMUNITY): Payer: Self-pay | Admitting: Internal Medicine

## 2015-01-01 ENCOUNTER — Encounter (INDEPENDENT_AMBULATORY_CARE_PROVIDER_SITE_OTHER): Payer: Self-pay | Admitting: *Deleted

## 2015-01-26 ENCOUNTER — Telehealth: Payer: Self-pay | Admitting: Endocrinology

## 2015-01-26 NOTE — Telephone Encounter (Signed)
Patient wife ask if her husband could have his labs drawn May 14, becacuse of the radiation therapy.

## 2015-01-26 NOTE — Telephone Encounter (Signed)
Contacted pt's wife back and advised [name] is currently out of town. Once he returns on Monday I will discuss with him about lab orders. Wife voiced understanding.

## 2015-02-20 ENCOUNTER — Telehealth: Payer: Self-pay | Admitting: Endocrinology

## 2015-02-20 DIAGNOSIS — D696 Thrombocytopenia, unspecified: Secondary | ICD-10-CM

## 2015-02-20 DIAGNOSIS — C73 Malignant neoplasm of thyroid gland: Secondary | ICD-10-CM

## 2015-02-20 DIAGNOSIS — E890 Postprocedural hypothyroidism: Secondary | ICD-10-CM

## 2015-02-20 NOTE — Telephone Encounter (Signed)
See note below and please advise, Thanks! 

## 2015-02-20 NOTE — Telephone Encounter (Signed)
Patient ask if patient could have his labs drawn May 16, could you please let her know, leave message on cell # [phone]

## 2015-02-21 NOTE — Telephone Encounter (Signed)
Patient scheduled for 03/12/2015 at 3pm to have his labs drawn.

## 2015-02-21 NOTE — Telephone Encounter (Signed)
done

## 2015-03-12 ENCOUNTER — Other Ambulatory Visit (INDEPENDENT_AMBULATORY_CARE_PROVIDER_SITE_OTHER): Payer: BLUE CROSS/BLUE SHIELD

## 2015-03-12 DIAGNOSIS — E890 Postprocedural hypothyroidism: Secondary | ICD-10-CM | POA: Diagnosis not present

## 2015-03-12 DIAGNOSIS — C73 Malignant neoplasm of thyroid gland: Secondary | ICD-10-CM

## 2015-03-12 DIAGNOSIS — D696 Thrombocytopenia, unspecified: Secondary | ICD-10-CM | POA: Diagnosis not present

## 2015-03-12 LAB — CBC WITH DIFFERENTIAL/PLATELET
BASOS ABS: 0 10*3/uL (ref 0.0–0.1)
Basophils Relative: 0.4 % (ref 0.0–3.0)
Eosinophils Absolute: 0.1 10*3/uL (ref 0.0–0.7)
Eosinophils Relative: 1.8 % (ref 0.0–5.0)
HCT: 43 % (ref 39.0–52.0)
Hemoglobin: 14.7 g/dL (ref 13.0–17.0)
Lymphocytes Relative: 35.6 % (ref 12.0–46.0)
Lymphs Abs: 1.8 10*3/uL (ref 0.7–4.0)
MCHC: 34.2 g/dL (ref 30.0–36.0)
MCV: 88.2 fl (ref 78.0–100.0)
MONO ABS: 0.4 10*3/uL (ref 0.1–1.0)
MONOS PCT: 7.5 % (ref 3.0–12.0)
NEUTROS PCT: 54.7 % (ref 43.0–77.0)
Neutro Abs: 2.7 10*3/uL (ref 1.4–7.7)
PLATELETS: 166 10*3/uL (ref 150.0–400.0)
RBC: 4.87 Mil/uL (ref 4.22–5.81)
RDW: 12.8 % (ref 11.5–15.5)
WBC: 4.9 10*3/uL (ref 4.0–10.5)

## 2015-03-12 LAB — TSH: TSH: 0.09 u[IU]/mL — ABNORMAL LOW (ref 0.35–4.50)

## 2015-03-13 ENCOUNTER — Telehealth: Payer: Self-pay | Admitting: Endocrinology

## 2015-03-13 LAB — THYROGLOBULIN ANTIBODY: Thyroglobulin Ab: <1 IU/mL (ref <2)

## 2015-03-13 LAB — THYROGLOBULIN LEVEL: THYROGLOBULIN: 11.4 ng/mL (ref 2.8–40.9)

## 2015-03-13 NOTE — Telephone Encounter (Signed)
I contacted the patient advised of recent lab work. Patient voiced understanding.

## 2015-03-13 NOTE — Telephone Encounter (Signed)
Patient is call for the result of his labwork

## 2015-04-04 ENCOUNTER — Encounter: Payer: Self-pay | Admitting: Endocrinology

## 2015-04-04 ENCOUNTER — Ambulatory Visit (INDEPENDENT_AMBULATORY_CARE_PROVIDER_SITE_OTHER): Payer: BLUE CROSS/BLUE SHIELD | Admitting: Endocrinology

## 2015-04-04 VITALS — BP 122/86 | HR 97 | Temp 97.8°F | Wt 234.0 lb

## 2015-04-04 DIAGNOSIS — C73 Malignant neoplasm of thyroid gland: Secondary | ICD-10-CM | POA: Diagnosis not present

## 2015-04-04 DIAGNOSIS — E890 Postprocedural hypothyroidism: Secondary | ICD-10-CM | POA: Diagnosis not present

## 2015-04-04 NOTE — Patient Instructions (Signed)
blood tests are requested for you today.  We'll let you know about the results.   Please come back for a follow-up appointment in 6 months.  

## 2015-04-04 NOTE — Progress Notes (Signed)
Subjective:    Patient ID: Jorge Ellis, male    DOB: [birth year], 49 y.o.   MRN: [id number]  HPI Pt returns for MALIGNANT NEOPLASM OF THYROID GLAND ([id number])  follicular variant of papillary thyroid cancer, stage-1 (due to age just less than 2, at time of Dx--he otherwise would have been stage-3). 5/10 thyroidectomy, T3 N1a M0     7/10 i-131 156 mci  7/10 post-therapy scan has uptake at the thyroid bed only  12/10: TG (off synthroid)=296 (ab neg) 1/11 i-131 195 mci  1/11 post-therapy scan pos at the neck  1/11 PET CT pos for node at left supraclavicular area  5/11 TG (on synthroid)=10.5 (ab borderline)  7/11 TG=354 (off synthroid) (ab neg)  9/11 TG (on synthroid)=12 (ab neg)  7/12: TG (on synthroid)=13 (ab neg) 12/12: PET CT: neg 6/13: TG (on synthroid)=15 (ab neg) 12/13 TG (on synthroid)=26 (ab neg) 12/13: neck US neg 6/14: TG (on synthroid)=20 (ab neg) 12/14: TG(on synthroid)=25 (ab neg) 6/15: TG (on synthroid)=20 (ab neg) He does not notice any nodule at the neck.   He wants to wait until next year for the I-131 rx.  pt states he feels well in general.  Postsurgical hypothyroidism: denies weight change.   Past Medical History  Diagnosis Date  . Malignant neoplasm of thyroid gland 07/02/7590    follicular variant of papillary thyroid cancer 5/10 thyroidectomy, t3 M1 no 7/10 I-131 156 mci 7/10 post-therapy scan has uptake at the thryoid bed only 1/11 I-131 195 mci 1/11 post-therapy scan pos at the neck 1/11 pet scan pos for node at left supraclavicular area 5/11 thyroglobulin (on synthroid)=10.5 (ab borderline) 7/11 thyroglobulin=354 (off synthroid) (ab neg) 9/11 thyroglobulin =12 ab neg   . HYPOTHYROIDISM, POSTSURGICAL 05/02/2009  . Hypocalcemia 07/04/2010  . HYPERTENSION 05/02/2009  . PERIPHERAL EDEMA 11/12/2009  . G E R D 05/02/2009  . Sleep apnea   . Hypercholesteremia     Past Surgical History  Procedure Laterality Date  . Total thyroidectomy  Mar 02, 2009  .  Uvulopalatopharyngoplasty  2005  . Colonoscopy    . Colonoscopy N/A 12/27/2014    Procedure: COLONOSCOPY;  Surgeon: Rogene Houston, MD;  Location: [facility];  Service: Endoscopy;  Laterality: N/A;  930    History   Social History  . Marital Status: Married    Spouse Name: N/A  . Number of Children: N/A  . Years of Education: N/A   Occupational History  . Machinist    Social History Main Topics  . Smoking status: Never Smoker   . Smokeless tobacco: Not on file  . Alcohol Use: No  . Drug Use: No  . Sexual Activity: Not on file   Other Topics Concern  . Not on file   Social History Narrative    Current Outpatient Prescriptions on File Prior to Visit  Medication Sig Dispense Refill  . Cholecalciferol (VITAMIN D3) 5000 UNITS CAPS Take 1 tablet by mouth daily.    . ferrous sulfate 325 (65 FE) MG tablet Take 325 mg by mouth daily with breakfast.    . Ginkgo Biloba (GINKOBA PO) Take 2 tablets by mouth daily.    Marland Kitchen levothyroxine (SYNTHROID, LEVOTHROID) 200 MCG tablet Take 1 tablet (200 mcg total) by mouth daily before breakfast. 30 tablet 11  . lisinopril (PRINIVIL,ZESTRIL) 20 MG tablet Take 40 mg by mouth daily.     . niacin (NIASPAN) 1000 MG CR tablet Take 1,000 mg by mouth 2 (two) times daily.    Marland Kitchen  omeprazole (PRILOSEC) 40 MG capsule Take 40 mg by mouth daily.      . vitamin B-12 (CYANOCOBALAMIN) 1000 MCG tablet Take 1,000 mcg by mouth daily.    . Vitamin K, Phytonadione, 100 MCG TABS Take 1 tablet by mouth daily.    . furosemide (LASIX) 20 MG tablet 1 tablet by mouth once daily as needed for swelling      No current facility-administered medications on file prior to visit.    No Known Allergies  Family History  Problem Relation Age of Onset  . Thyroid disease Mother   . Thyroid disease Sister     had thyroidectomy (benign)    BP 122/86 mmHg  Pulse 97  Temp(Src) 97.8 F (36.6 C) (Oral)  Wt 234 lb (106.142 kg)  SpO2 98%  Review of Systems Denies fatigue.       Objective:   Physical Exam VITAL SIGNS:  See vs page GENERAL: no distress Neck: a healed scar is present.  i do not appreciate a nodule in the thyroid or elsewhere in the neck   Lab Results  Component Value Date   TSH 0.12* 04/04/2015  TG=11    Assessment & Plan:  Thyroid cancer: clinically stable.  He'll need further rx at some point, but pt wants to defer.  This is ok with me, as TG is stable. Postsurgical hypothyroidism: well-replaced  Patient is advised the following: Patient Instructions  blood tests are requested for you today.  We'll let you know about the results. Please come back for a follow-up appointment in 6 months.

## 2015-04-05 LAB — THYROGLOBULIN LEVEL: Thyroglobulin: 10.3 ng/mL (ref 2.8–40.9)

## 2015-04-05 LAB — THYROGLOBULIN ANTIBODY: Thyroglobulin Ab: <1 IU/mL (ref <2)

## 2015-04-05 LAB — TSH: TSH: 0.12 u[IU]/mL — AB (ref 0.35–4.50)

## 2015-04-09 ENCOUNTER — Telehealth: Payer: Self-pay | Admitting: Endocrinology

## 2015-04-09 NOTE — Telephone Encounter (Signed)
Please call pt with lab results

## 2015-04-09 NOTE — Telephone Encounter (Signed)
Patient advised of note below and voiced understanding.  

## 2015-08-17 ENCOUNTER — Other Ambulatory Visit: Payer: Self-pay | Admitting: Endocrinology

## 2015-10-03 ENCOUNTER — Encounter: Payer: Self-pay | Admitting: Endocrinology

## 2015-10-03 ENCOUNTER — Ambulatory Visit (INDEPENDENT_AMBULATORY_CARE_PROVIDER_SITE_OTHER): Payer: BLUE CROSS/BLUE SHIELD | Admitting: Endocrinology

## 2015-10-03 VITALS — BP 137/93 | HR 86 | Temp 98.7°F | Ht 74.0 in | Wt 231.0 lb

## 2015-10-03 DIAGNOSIS — E890 Postprocedural hypothyroidism: Secondary | ICD-10-CM

## 2015-10-03 DIAGNOSIS — C73 Malignant neoplasm of thyroid gland: Secondary | ICD-10-CM

## 2015-10-03 LAB — TSH: TSH: 0.11 u[IU]/mL — ABNORMAL LOW (ref 0.35–4.50)

## 2015-10-03 NOTE — Progress Notes (Signed)
Subjective:    Patient ID: Jorge Ellis, male    DOB: [birth year], 49 y.o.   MRN: [id number]  HPI Pt returns for MALIGNANT NEOPLASM OF THYROID GLAND (99991111)  follicular variant of papillary thyroid cancer, stage-1 (due to age just less than 41, at time of Dx--he otherwise would have been stage-3). 5/10 thyroidectomy, T3 N1a M0     7/10 i-131 156 mci  7/10 post-therapy scan has uptake at the thyroid bed only  12/10: TG (off synthroid)=296 (ab neg) 1/11 i-131 195 mci  1/11 post-therapy scan pos at the neck  1/11 PET CT pos for node at left supraclavicular area  5/11 TG (on synthroid)=10.5 (ab borderline)  7/11 TG=354 (off synthroid) (ab neg)  9/11 TG (on synthroid)=12 (ab neg)  7/12: TG (on synthroid)=13 (ab neg) 12/12: PET CT: neg 6/13: TG (on synthroid)=15 (ab neg) 12/13 TG (on synthroid)=26 (ab neg) 12/13: neck US neg 6/14: TG (on synthroid)=20 (ab neg) 12/14: TG(on synthroid)=25 (ab neg) 6/15: TG (on synthroid)=20 (ab neg) 6/16: TG (on synthroid)=10 (ab neg) He does not notice any nodule at the neck.   He wants to wait until next year for the I-131 rx.  pt states he feels well in general.  Postsurgical hypothyroidism: he denies weight change.   He feels the hypocalcemia has recurred.  he has intermittent muscle cramps Past Medical History  Diagnosis Date  . Malignant neoplasm of thyroid gland ([place]) 0000000    follicular variant of papillary thyroid cancer 5/10 thyroidectomy, t3 M1 no 7/10 I-131 156 mci 7/10 post-therapy scan has uptake at the thryoid bed only 1/11 I-131 195 mci 1/11 post-therapy scan pos at the neck 1/11 pet scan pos for node at left supraclavicular area 5/11 thyroglobulin (on synthroid)=10.5 (ab borderline) 7/11 thyroglobulin=354 (off synthroid) (ab neg) 9/11 thyroglobulin =12 ab neg   . HYPOTHYROIDISM, POSTSURGICAL 05/02/2009  . Hypocalcemia 07/04/2010  . HYPERTENSION 05/02/2009  . PERIPHERAL EDEMA 11/12/2009  . G E R D 05/02/2009  . Sleep apnea   .  Hypercholesteremia     Past Surgical History  Procedure Laterality Date  . Total thyroidectomy  Mar 02, 2009  . Uvulopalatopharyngoplasty  2005  . Colonoscopy    . Colonoscopy N/A 12/27/2014    Procedure: COLONOSCOPY;  Surgeon: Rogene Houston, MD;  Location: [facility];  Service: Endoscopy;  Laterality: N/A;  930    Social History   Social History  . Marital Status: Married    Spouse Name: N/A  . Number of Children: N/A  . Years of Education: N/A   Occupational History  . Machinist    Social History Main Topics  . Smoking status: Never Smoker   . Smokeless tobacco: Not on file  . Alcohol Use: No  . Drug Use: No  . Sexual Activity: Not on file   Other Topics Concern  . Not on file   Social History Narrative    Current Outpatient Prescriptions on File Prior to Visit  Medication Sig Dispense Refill  . Cholecalciferol (VITAMIN D3) 5000 UNITS CAPS Take 1 tablet by mouth daily.    . ferrous sulfate 325 (65 FE) MG tablet Take 325 mg by mouth daily with breakfast.    . furosemide (LASIX) 20 MG tablet 1 tablet by mouth once daily as needed for swelling     . Ginkgo Biloba (GINKOBA PO) Take 2 tablets by mouth daily.    Marland Kitchen levothyroxine (SYNTHROID, LEVOTHROID) 200 MCG tablet TAKE ONE TABLET BY MOUTH ONCE DAILY BEFORE  BREAKFAST 30 tablet 0  . lisinopril (PRINIVIL,ZESTRIL) 20 MG tablet Take 40 mg by mouth daily.     . niacin (NIASPAN) 1000 MG CR tablet Take 1,000 mg by mouth 2 (two) times daily.    . vitamin B-12 (CYANOCOBALAMIN) 1000 MCG tablet Take 1,000 mcg by mouth daily.    . Vitamin K, Phytonadione, 100 MCG TABS Take 1 tablet by mouth daily.     No current facility-administered medications on file prior to visit.    No Known Allergies  Family History  Problem Relation Age of Onset  . Thyroid disease Mother   . Thyroid disease Sister     had thyroidectomy (benign)    BP 137/93 mmHg  Pulse 86  Temp(Src) 98.7 F (37.1 C) (Oral)  Ht 6\' 2"  (1.88 m)  Wt 231 lb  (104.781 kg)  BMI 29.65 kg/m2  SpO2 95%   Review of Systems Denies tremor and numbness    Objective:   Physical Exam VITAL SIGNS:  See vs page GENERAL: no distress Neck: a healed scar is present.  i do not appreciate a nodule in the thyroid or elsewhere in the neck.     Lab Results  Component Value Date   TSH 0.11* 10/03/2015      Assessment & Plan:  Hypothyroidism: TSH is appropriately suppressed Differentiated thyroid cancer: we discussed: he wants to wait longer before any further I-131 rx Cramps: ? recurrence of hypocalcemia  Patient is advised the following: Patient Instructions  blood tests are requested for you today.  We'll let you know about the results.   If we can wait longer before the next radioactive iodine treatment, we will.   Please come back for a follow-up appointment in 6 months.

## 2015-10-03 NOTE — Patient Instructions (Addendum)
blood tests are requested for you today.  We'll let you know about the results.   If we can wait longer before the next radioactive iodine treatment, we will.   Please come back for a follow-up appointment in 6 months.

## 2015-10-04 LAB — VITAMIN D 25 HYDROXY (VIT D DEFICIENCY, FRACTURES): VITD: 32.5 ng/mL (ref 30.00–100.00)

## 2015-10-05 LAB — PTH, INTACT AND CALCIUM
Calcium: 8.9 mg/dL (ref 8.4–10.5)
PTH: 30 pg/mL (ref 14–64)

## 2015-10-05 LAB — THYROGLOBULIN ANTIBODY

## 2015-10-05 LAB — THYROGLOBULIN LEVEL: THYROGLOBULIN: 9.2 ng/mL (ref 2.8–40.9)

## 2015-10-08 ENCOUNTER — Telehealth: Payer: Self-pay | Admitting: Endocrinology

## 2015-10-08 NOTE — Telephone Encounter (Signed)
I contacted the pt and advised of blood tests from 10/05/2015. Requested a call back if the pt would like discuss.

## 2015-10-08 NOTE — Telephone Encounter (Signed)
Pt would like to know his lab results please

## 2015-10-09 ENCOUNTER — Other Ambulatory Visit: Payer: Self-pay | Admitting: Endocrinology

## 2015-11-22 ENCOUNTER — Telehealth: Payer: Self-pay | Admitting: Endocrinology

## 2015-11-22 ENCOUNTER — Other Ambulatory Visit: Payer: Self-pay | Admitting: Endocrinology

## 2015-11-22 NOTE — Telephone Encounter (Signed)
Patient need refill of levothyroxine 90 day supply, send to  [facility] [address], [place] (Phone) [phone] (Fax)

## 2015-11-23 MED ORDER — LEVOTHYROXINE SODIUM 200 MCG PO TABS: ORAL_TABLET | ORAL | Status: DC

## 2015-11-23 NOTE — Telephone Encounter (Signed)
Rx sent per pt's request.  

## 2016-03-15 DIAGNOSIS — Z0001 Encounter for general adult medical examination with abnormal findings: Secondary | ICD-10-CM | POA: Diagnosis not present

## 2016-03-15 DIAGNOSIS — M10071 Idiopathic gout, right ankle and foot: Secondary | ICD-10-CM | POA: Diagnosis not present

## 2016-03-15 DIAGNOSIS — I10 Essential (primary) hypertension: Secondary | ICD-10-CM | POA: Diagnosis not present

## 2016-03-15 DIAGNOSIS — E039 Hypothyroidism, unspecified: Secondary | ICD-10-CM | POA: Diagnosis not present

## 2016-03-15 DIAGNOSIS — E782 Mixed hyperlipidemia: Secondary | ICD-10-CM | POA: Diagnosis not present

## 2016-03-15 DIAGNOSIS — Z1212 Encounter for screening for malignant neoplasm of rectum: Secondary | ICD-10-CM | POA: Diagnosis not present

## 2016-04-02 ENCOUNTER — Ambulatory Visit: Payer: BLUE CROSS/BLUE SHIELD | Admitting: Endocrinology

## 2016-05-02 ENCOUNTER — Ambulatory Visit (INDEPENDENT_AMBULATORY_CARE_PROVIDER_SITE_OTHER): Payer: BLUE CROSS/BLUE SHIELD | Admitting: Endocrinology

## 2016-05-02 ENCOUNTER — Encounter: Payer: Self-pay | Admitting: Endocrinology

## 2016-05-02 VITALS — BP 122/80 | HR 66 | Wt 232.0 lb

## 2016-05-02 DIAGNOSIS — C73 Malignant neoplasm of thyroid gland: Secondary | ICD-10-CM | POA: Diagnosis not present

## 2016-05-02 DIAGNOSIS — E890 Postprocedural hypothyroidism: Secondary | ICD-10-CM | POA: Diagnosis not present

## 2016-05-02 LAB — TSH: TSH: 0.14 u[IU]/mL — ABNORMAL LOW (ref 0.35–4.50)

## 2016-05-02 NOTE — Progress Notes (Signed)
Subjective:    Patient ID: Jorge Ellis, male    DOB: 11/29/1965, 50 y.o.   MRN: [id number]  HPI Pt returns for MALIGNANT NEOPLASM OF THYROID GLAND (99991111)  follicular variant of papillary thyroid cancer, stage-1 (due to age just less than 50, at time of Dx--he otherwise would have been stage-3). 5/10 thyroidectomy, T3 N1a M0     7/10 i-131 156 mci  7/10 post-therapy scan has uptake at the thyroid bed only  12/10: TG (off synthroid)=296 (ab neg) 1/11 i-131 195 mci  1/11 post-therapy scan pos at the neck  1/11 PET CT pos for node at left supraclavicular area  5/11 TG (on synthroid)=10.5 (ab borderline)  7/11 TG=354 (off synthroid) (ab neg)  9/11 TG (on synthroid)=12 (ab neg)  7/12: TG (on synthroid)=13 (ab neg) 12/12: PET CT: neg 6/13: TG (on synthroid)=15 (ab neg) 12/13 TG (on synthroid)=26 (ab neg) 12/13: neck US neg 6/14: TG (on synthroid)=20 (ab neg) 12/14: TG(on synthroid)=25 (ab neg) 6/15: TG (on synthroid)=20 (ab neg) 6/16: TG (on synthroid)=10 (ab neg) 12/16: TG (on synthroid)=9 (ab neg).  He does not notice any nodule at the neck, or assoc pain.  He wants to wait until 2018 for the I-131 rx.  pt states he feels well in general.  Postsurgical hypothyroidism: he keeps a slightly suppressed TSH for his cancer.  He denies weight change.   Past Medical History  Diagnosis Date  . Malignant neoplasm of thyroid gland ([place]) 0000000    follicular variant of papillary thyroid cancer 5/10 thyroidectomy, t3 M1 no 7/10 I-131 156 mci 7/10 post-therapy scan has uptake at the thryoid bed only 1/11 I-131 195 mci 1/11 post-therapy scan pos at the neck 1/11 pet scan pos for node at left supraclavicular area 5/11 thyroglobulin (on synthroid)=10.5 (ab borderline) 7/11 thyroglobulin=354 (off synthroid) (ab neg) 9/11 thyroglobulin =12 ab neg   . HYPOTHYROIDISM, POSTSURGICAL 05/02/2009  . Hypocalcemia 07/04/2010  . HYPERTENSION 05/02/2009  . PERIPHERAL EDEMA 11/12/2009  . G E R D 05/02/2009  .  Sleep apnea   . Hypercholesteremia     Past Surgical History  Procedure Laterality Date  . Total thyroidectomy  Mar 02, 2009  . Uvulopalatopharyngoplasty  2005  . Colonoscopy    . Colonoscopy N/A 12/27/2014    Procedure: COLONOSCOPY;  Surgeon: Rogene Houston, MD;  [facility];  Service: Endoscopy;  Laterality: N/A;  930    Social History   Social History  . Marital Status: Married    Spouse Name: N/A  . Number of Children: N/A  . Years of Education: N/A   Occupational History  . Machinist    Social History Main Topics  . Smoking status: Never Smoker   . Smokeless tobacco: Not on file  . Alcohol Use: No  . Drug Use: No  . Sexual Activity: Not on file   Other Topics Concern  . Not on file   Social History Narrative    Current Outpatient Prescriptions on File Prior to Visit  Medication Sig Dispense Refill  . Cholecalciferol (VITAMIN D3) 5000 UNITS CAPS Take 1 tablet by mouth daily.    . ferrous sulfate 325 (65 FE) MG tablet Take 325 mg by mouth daily with breakfast.    . furosemide (LASIX) 20 MG tablet 1 tablet by mouth once daily as needed for swelling     . Ginkgo Biloba (GINKOBA PO) Take 2 tablets by mouth daily.    Marland Kitchen levothyroxine (SYNTHROID, LEVOTHROID) 200 MCG tablet TAKE ONE  TABLET BY MOUTH ONCE DAILY BEFORE BREAKFAST 90 tablet 2  . lisinopril (PRINIVIL,ZESTRIL) 20 MG tablet Take 40 mg by mouth daily.     . niacin (NIASPAN) 1000 MG CR tablet Take 1,000 mg by mouth 2 (two) times daily.    . vitamin B-12 (CYANOCOBALAMIN) 1000 MCG tablet Take 1,000 mcg by mouth daily.    . Vitamin K, Phytonadione, 100 MCG TABS Take 1 tablet by mouth daily.     No current facility-administered medications on file prior to visit.    No Known Allergies  Family History  Problem Relation Age of Onset  . Thyroid disease Mother   . Thyroid disease Sister     had thyroidectomy (benign)    BP 122/80 mmHg  Pulse 66  Wt 232 lb (105.235 kg)  SpO2 97%  Review of  Systems Denies edema    Objective:   Physical Exam VITAL SIGNS:  See vs page GENERAL: no distress Neck: a healed scar is present.  i do not appreciate a nodule in the thyroid or elsewhere in the neck.    TG=12  Lab Results  Component Value Date   TSH 0.14* 05/02/2016      Assessment & Plan:  Differentiated thyroid cancer: stable.  We are trying to delay any further RAI rx. Postsurgical hypothyroidism: TSH is appropriately suppressed.   Patient is advised the following: Patient Instructions  blood tests are requested for you today.  We'll let you know about the results.   If we can wait until next summer before the next radioactive iodine treatment, we will.   Please come back for a follow-up appointment in 6 months.    [name], MD

## 2016-05-02 NOTE — Patient Instructions (Addendum)
blood tests are requested for you today.  We'll let you know about the results.   If we can wait until next summer before the next radioactive iodine treatment, we will.   Please come back for a follow-up appointment in 6 months.

## 2016-05-03 LAB — THYROGLOBULIN LEVEL: Thyroglobulin: 12.5 ng/mL (ref 2.8–40.9)

## 2016-05-03 LAB — THYROGLOBULIN ANTIBODY: Thyroglobulin Ab: <1 IU/mL (ref <2)

## 2016-05-05 ENCOUNTER — Telehealth: Payer: Self-pay

## 2016-05-05 NOTE — Telephone Encounter (Signed)
Called and spoke with patient, notified him of normal results, and results that stayed the same. No change in medication needed and to keep him follow up appointment in 6 months. Patient acknowledged and asked for lab results be mailed to his home, which I will complete.

## 2016-08-18 ENCOUNTER — Other Ambulatory Visit: Payer: Self-pay | Admitting: Endocrinology

## 2016-09-16 DIAGNOSIS — E782 Mixed hyperlipidemia: Secondary | ICD-10-CM | POA: Diagnosis not present

## 2016-09-16 DIAGNOSIS — M10071 Idiopathic gout, right ankle and foot: Secondary | ICD-10-CM | POA: Diagnosis not present

## 2016-09-16 DIAGNOSIS — I10 Essential (primary) hypertension: Secondary | ICD-10-CM | POA: Diagnosis not present

## 2016-09-16 DIAGNOSIS — E039 Hypothyroidism, unspecified: Secondary | ICD-10-CM | POA: Diagnosis not present

## 2016-10-03 ENCOUNTER — Ambulatory Visit (INDEPENDENT_AMBULATORY_CARE_PROVIDER_SITE_OTHER): Payer: BLUE CROSS/BLUE SHIELD | Admitting: Endocrinology

## 2016-10-03 VITALS — BP 122/80 | HR 95 | Wt 236.0 lb

## 2016-10-03 DIAGNOSIS — E890 Postprocedural hypothyroidism: Secondary | ICD-10-CM | POA: Diagnosis not present

## 2016-10-03 DIAGNOSIS — C73 Malignant neoplasm of thyroid gland: Secondary | ICD-10-CM

## 2016-10-03 LAB — TSH: TSH: 0.05 u[IU]/mL — ABNORMAL LOW (ref 0.35–4.50)

## 2016-10-03 MED ORDER — LEVOTHYROXINE SODIUM 175 MCG PO TABS: 175.0000 ug | ORAL_TABLET | Freq: Every day | ORAL | 3 refills | Status: DC

## 2016-10-03 NOTE — Progress Notes (Signed)
Subjective:    Patient ID: Jorge Ellis, male    DOB: 04-14-1966, 50 y.o.   MRN: [id number]  HPI Pt returns for MALIGNANT NEOPLASM OF THYROID GLAND (99991111)  follicular variant of papillary thyroid cancer, stage-1 (due to age just less than 25, at time of Dx--he otherwise would have been stage-3). 5/10 thyroidectomy, T3 N1a M0     7/10 RAI 156 mci  7/10 post-therapy scan has uptake at the thyroid bed only  12/10: TG (off synthroid)=296 (ab neg) 1/11 RAI 195 mci  1/11 post-therapy scan pos at the neck  1/11 PET CT pos for node at left supraclavicular area  5/11 TG (on synthroid)=10.5 (ab borderline)  7/11 TG=354 (off synthroid) (ab neg)  9/11 TG (on synthroid)=12 (ab neg)  7/12: TG (on synthroid)=13 (ab neg) 12/12: PET CT: neg 6/13: TG (on synthroid)=15 (ab neg) 12/13 TG (on synthroid)=26 (ab neg) 12/13: neck US neg 6/14: TG (on synthroid)=20 (ab neg) 12/14: TG(on synthroid)=25 (ab neg) 6/15: TG (on synthroid)=20 (ab neg) 6/16: TG (on synthroid)=10 (ab neg) 12/16: TG (on synthroid)=9 (ab neg).  7/17: TG (on synthroid)=13 (ab neg).  He does not notice any nodule at the neck, or assoc pain.  He wants to wait until 2018 for the RAI.  pt states he feels well in general.  Postsurgical hypothyroidism: he keeps a slightly suppressed TSH for his cancer.  He denies weight change.   Past Medical History:  Diagnosis Date  . G E R D 05/02/2009  . Hypercholesteremia   . HYPERTENSION 05/02/2009  . Hypocalcemia 07/04/2010  . HYPOTHYROIDISM, POSTSURGICAL 05/02/2009  . Malignant neoplasm of thyroid gland ([place]) 0000000   follicular variant of papillary thyroid cancer 5/10 thyroidectomy, t3 M1 no 7/10 I-131 156 mci 7/10 post-therapy scan has uptake at the thryoid bed only 1/11 I-131 195 mci 1/11 post-therapy scan pos at the neck 1/11 pet scan pos for node at left supraclavicular area 5/11 thyroglobulin (on synthroid)=10.5 (ab borderline) 7/11 thyroglobulin=354 (off synthroid) (ab neg) 9/11  thyroglobulin =12 ab neg   . PERIPHERAL EDEMA 11/12/2009  . Sleep apnea     Past Surgical History:  Procedure Laterality Date  . COLONOSCOPY    . COLONOSCOPY N/A 12/27/2014   Procedure: COLONOSCOPY;  Surgeon: Rogene Houston, MD;  [facility];  Service: Endoscopy;  Laterality: N/A;  930  . TOTAL THYROIDECTOMY  Mar 02, 2009  . UVULOPALATOPHARYNGOPLASTY  2005    Social History   Social History  . Marital status: Married    Spouse name: N/A  . Number of children: N/A  . Years of education: N/A   Occupational History  . Machinist    Social History Main Topics  . Smoking status: Never Smoker  . Smokeless tobacco: Not on file  . Alcohol use No  . Drug use: No  . Sexual activity: Not on file   Other Topics Concern  . Not on file   Social History Narrative  . No narrative on file    Current Outpatient Prescriptions on File Prior to Visit  Medication Sig Dispense Refill  . Cholecalciferol (VITAMIN D3) 5000 UNITS CAPS Take 1 tablet by mouth daily.    . ferrous sulfate 325 (65 FE) MG tablet Take 325 mg by mouth daily with breakfast.    . furosemide (LASIX) 20 MG tablet 1 tablet by mouth once daily as needed for swelling     . Ginkgo Biloba (GINKOBA PO) Take 2 tablets by mouth daily.    Marland Kitchen  lisinopril (PRINIVIL,ZESTRIL) 20 MG tablet Take 40 mg by mouth daily.     . vitamin B-12 (CYANOCOBALAMIN) 1000 MCG tablet Take 1,000 mcg by mouth daily.    . Vitamin K, Phytonadione, 100 MCG TABS Take 1 tablet by mouth daily.     No current facility-administered medications on file prior to visit.     No Known Allergies  Family History  Problem Relation Age of Onset  . Thyroid disease Mother   . Thyroid disease Sister     had thyroidectomy (benign)    BP 122/80   Pulse 95   Wt 236 lb (107 kg)   SpO2 96%   BMI 30.30 kg/m    Review of Systems Denies numbness    Objective:   Physical Exam VITAL SIGNS:  See vs page GENERAL: no distress Neck: a healed scar is  present.  i do not appreciate a nodule in the thyroid or elsewhere in the neck   Lab Results  Component Value Date   TSH 0.05 (L) 10/03/2016      Assessment & Plan:  Postsurgical hypothyroidism, overreplaced.  Reduce synthroid.  Goal TSH is approx 0.1-0.4. Differentiated thyroid cancer. Recheck labs. Patient is advised the following: Patient Instructions  blood tests are requested for you today.  We'll let you know about the results.   If we can wait until next summer before the next radioactive iodine treatment, we will.   Please come back for a follow-up appointment in 6 months.  Our tentative plan then will be to first do the thyroid x-ray (for which you don't have to stop the medication).  This way, if there is a lump of cancer somewhere, you might be able to just have it removed, rather than taking the radioactive iodine pill.  If the test does not show anything, we would do the radioactive iodine pill, then you would have a scan a few days later.   Another option is to first do an ultrasound, to see if there is a nodule (although this was normal in 2013).   If you have a preference, please let me know.

## 2016-10-03 NOTE — Patient Instructions (Addendum)
blood tests are requested for you today.  We'll let you know about the results.   If we can wait until next summer before the next radioactive iodine treatment, we will.   Please come back for a follow-up appointment in 6 months.  Our tentative plan then will be to first do the thyroid x-ray (for which you don't have to stop the medication).  This way, if there is a lump of cancer somewhere, you might be able to just have it removed, rather than taking the radioactive iodine pill.  If the test does not show anything, we would do the radioactive iodine pill, then you would have a scan a few days later.   Another option is to first do an ultrasound, to see if there is a nodule (although this was normal in 2013).   If you have a preference, please let me know.

## 2016-10-04 LAB — THYROGLOBULIN ANTIBODY

## 2016-10-04 LAB — THYROGLOBULIN LEVEL: THYROGLOBULIN: 6.6 ng/mL

## 2016-12-02 DIAGNOSIS — Z6831 Body mass index (BMI) 31.0-31.9, adult: Secondary | ICD-10-CM | POA: Diagnosis not present

## 2016-12-02 DIAGNOSIS — J0100 Acute maxillary sinusitis, unspecified: Secondary | ICD-10-CM | POA: Diagnosis not present

## 2017-04-03 ENCOUNTER — Ambulatory Visit (INDEPENDENT_AMBULATORY_CARE_PROVIDER_SITE_OTHER): Payer: BLUE CROSS/BLUE SHIELD | Admitting: Endocrinology

## 2017-04-03 ENCOUNTER — Encounter: Payer: Self-pay | Admitting: Endocrinology

## 2017-04-03 VITALS — BP 140/80 | HR 83 | Ht 74.0 in | Wt 235.0 lb

## 2017-04-03 DIAGNOSIS — C73 Malignant neoplasm of thyroid gland: Secondary | ICD-10-CM | POA: Diagnosis not present

## 2017-04-03 DIAGNOSIS — E890 Postprocedural hypothyroidism: Secondary | ICD-10-CM | POA: Diagnosis not present

## 2017-04-03 LAB — TSH: TSH: 0.14 u[IU]/mL — ABNORMAL LOW (ref 0.35–4.50)

## 2017-04-03 NOTE — Progress Notes (Signed)
Subjective:    Patient ID: Jorge Ellis, male    DOB: [birth year], 51 y.o.   MRN: [id number]  HPI Pt returns for MALIGNANT NEOPLASM OF THYROID GLAND (IWO-032)  follicular variant of papillary thyroid cancer, stage-1 (due to age just less than 27, at time of Dx--he otherwise would have been stage-3).  5/10 thyroidectomy, T3 N1a M0     7/10 RAI 156 mci  7/10 post-therapy scan has uptake at the thyroid bed only  12/10: TG (off synthroid)=296 (ab neg) 1/11 RAI 195 mci  1/11 post-therapy scan pos at the neck  1/11 PET CT pos for node at left supraclavicular area  5/11 TG (on synthroid)=10.5 (ab borderline)  7/11 TG=354 (off synthroid) (ab neg)  9/11 TG (on synthroid)=12 (ab neg)  7/12: TG (on synthroid)=13 (ab neg) 12/12: PET CT: neg 6/13: TG (on synthroid)=15 (ab neg) 12/13 TG (on synthroid)=26 (ab neg) 12/13: neck US neg 6/14: TG (on synthroid)=20 (ab neg) 12/14: TG(on synthroid)=25 (ab neg) 6/15: TG (on synthroid)=20 (ab neg) 6/16: TG (on synthroid)=10 (ab neg) 12/16: TG (on synthroid)=9 (ab neg).  7/17: TG (on synthroid)=13 (ab neg).  12/17: TG (on synthroid)=7 (ab neg). He does not notice any nodule at the neck, or assoc pain.  He says he'll agree to RAI now, unless TG is significantly improved.  pt states he feels well in general.  Postsurgical hypothyroidism: he keeps a slightly suppressed TSH for his cancer.  Past Medical History:  Diagnosis Date  . G E R D 05/02/2009  . Hypercholesteremia   . HYPERTENSION 05/02/2009  . Hypocalcemia 07/04/2010  . HYPOTHYROIDISM, POSTSURGICAL 05/02/2009  . Malignant neoplasm of thyroid gland ([place]) 10/28/2480   follicular variant of papillary thyroid cancer 5/10 thyroidectomy, t3 M1 no 7/10 I-131 156 mci 7/10 post-therapy scan has uptake at the thryoid bed only 1/11 I-131 195 mci 1/11 post-therapy scan pos at the neck 1/11 pet scan pos for node at left supraclavicular area 5/11 thyroglobulin (on synthroid)=10.5 (ab borderline) 7/11 thyroglobulin=354  (off synthroid) (ab neg) 9/11 thyroglobulin =12 ab neg   . PERIPHERAL EDEMA 11/12/2009  . Sleep apnea     Past Surgical History:  Procedure Laterality Date  . COLONOSCOPY    . COLONOSCOPY N/A 12/27/2014   Procedure: COLONOSCOPY;  Surgeon: Rogene Houston, MD;  [facility];  Service: Endoscopy;  Laterality: N/A;  930  . TOTAL THYROIDECTOMY  Mar 02, 2009  . UVULOPALATOPHARYNGOPLASTY  2005    Social History   Social History  . Marital status: Married    Spouse name: N/A  . Number of children: N/A  . Years of education: N/A   Occupational History  . Machinist    Social History Main Topics  . Smoking status: Never Smoker  . Smokeless tobacco: Former Systems developer  . Alcohol use No  . Drug use: No  . Sexual activity: Not on file   Other Topics Concern  . Not on file   Social History Narrative  . No narrative on file    Current Outpatient Prescriptions on File Prior to Visit  Medication Sig Dispense Refill  . lisinopril (PRINIVIL,ZESTRIL) 20 MG tablet Take 40 mg by mouth daily.     . Cholecalciferol (VITAMIN D3) 5000 UNITS CAPS Take 1 tablet by mouth daily.    . ferrous sulfate 325 (65 FE) MG tablet Take 325 mg by mouth daily with breakfast.    . furosemide (LASIX) 20 MG tablet 1 tablet by mouth once daily as needed for  swelling     . Ginkgo Biloba (GINKOBA PO) Take 2 tablets by mouth daily.    Marland Kitchen NIACIN CR PO Take 2,000 Units by mouth.    . vitamin B-12 (CYANOCOBALAMIN) 1000 MCG tablet Take 1,000 mcg by mouth daily.    . Vitamin K, Phytonadione, 100 MCG TABS Take 1 tablet by mouth daily.     No current facility-administered medications on file prior to visit.     No Known Allergies  Family History  Problem Relation Age of Onset  . Thyroid disease Mother   . Thyroid disease Sister        had thyroidectomy (benign)    BP 140/80   Pulse 83   Ht 6\' 2"  (1.88 m)   Wt 235 lb (106.6 kg)   SpO2 97%   BMI 30.17 kg/m    Review of Systems  He denies weight change.       Objective:   Physical Exam VITAL SIGNS:  See vs page GENERAL: no distress Neck: a healed scar is present.  i do not appreciate a nodule in the thyroid or elsewhere in the neck     Assessment & Plan:  Postsurgical hypothyroidism, due for recheck. Differentiated thyroid cancer. Recheck labs.  Patient Instructions  Step 1 (today): blood tests are requested for you today.  We'll let you know about the results.   Step 2: if as anticipated, the blood test is high, stop the levothyroxine.  Step 3: 1 week later, start triiodothyronine at a low dosage Step 4: blood test 1 week later Step 5: keep doing blood tests until TSH is up to 30 Step 6: schedule test scan. Step 7: when you know the date, stop triiodothyronine 1 week prior.  Step 8: get test scan done Step 9: know test result, resume triiodothyronine, probably request ultrasound.  Step 10: if ultrasound shows a spot, refer to surgery specialist.  If not, stop triodothyronine, and request radioactive iodine treatment pill.    Please come back for a follow-up appointment in 6 months.

## 2017-04-03 NOTE — Patient Instructions (Addendum)
Step 1 (today): blood tests are requested for you today.  We'll let you know about the results.   Step 2: if as anticipated, the blood test is high, stop the levothyroxine.  Step 3: 1 week later, start triiodothyronine at a low dosage Step 4: blood test 1 week later Step 5: keep doing blood tests until TSH is up to 30 Step 6: schedule test scan. Step 7: when you know the date, stop triiodothyronine 1 week prior.  Step 8: get test scan done Step 9: know test result, resume triiodothyronine, probably request ultrasound.  Step 10: if ultrasound shows a spot, refer to surgery specialist.  If not, stop triodothyronine, and request radioactive iodine treatment pill.    Please come back for a follow-up appointment in 6 months.

## 2017-04-07 LAB — THYROGLOBULIN LEVEL: Thyroglobulin: 7 ng/mL

## 2017-04-07 LAB — THYROGLOBULIN ANTIBODY: Thyroglobulin Ab: <1 IU/mL (ref <2)

## 2017-04-08 ENCOUNTER — Other Ambulatory Visit: Payer: Self-pay

## 2017-04-08 ENCOUNTER — Other Ambulatory Visit: Payer: Self-pay | Admitting: Endocrinology

## 2017-04-08 ENCOUNTER — Telehealth: Payer: Self-pay | Admitting: Family Medicine

## 2017-04-08 DIAGNOSIS — E890 Postprocedural hypothyroidism: Secondary | ICD-10-CM

## 2017-04-08 MED ORDER — LIOTHYRONINE SODIUM 5 MCG PO TABS: 5.0000 ug | ORAL_TABLET | Freq: Two times a day (BID) | ORAL | 1 refills | Status: DC

## 2017-04-08 NOTE — Telephone Encounter (Signed)
I contacted the patient. Patient was advised of the AVS instructions on 04/03/2017 about the plan for his medication and labs but wanted to confirm one question with the MD. I advised the patient [name] is currently out of town but he was addiment about me trying to send a message. Patient wanted to confirm after he has been of his levothyroxine for 7 days and before he starts the liothyronine if he should have another TSH tested? I advised this was not indicated earlier, but he thought it had been?  Please advise, Thanks!

## 2017-04-08 NOTE — Telephone Encounter (Signed)
Patient's wife "Kieth Brightly" calling to clarify when ultrasound will occur?  Thank you,  -LL

## 2017-04-08 NOTE — Telephone Encounter (Signed)
please call patient: Please stay off the levothyroxine Start leiothyronine (AKA triodothyronine) Please redo the blood test next week. I have ordered.

## 2017-04-09 NOTE — Telephone Encounter (Signed)
Requested a call back from the patient to discuss.  

## 2017-04-09 NOTE — Telephone Encounter (Signed)
Patient called asking for call back, anytime after 3 PM if possible.

## 2017-04-09 NOTE — Telephone Encounter (Signed)
Patient notified of message and voiced understanding. He had no further questions at this time.

## 2017-04-09 NOTE — Telephone Encounter (Deleted)
Pati

## 2017-04-11 DIAGNOSIS — E6609 Other obesity due to excess calories: Secondary | ICD-10-CM | POA: Diagnosis not present

## 2017-04-11 DIAGNOSIS — Z0001 Encounter for general adult medical examination with abnormal findings: Secondary | ICD-10-CM | POA: Diagnosis not present

## 2017-04-11 DIAGNOSIS — E782 Mixed hyperlipidemia: Secondary | ICD-10-CM | POA: Diagnosis not present

## 2017-04-11 DIAGNOSIS — M10071 Idiopathic gout, right ankle and foot: Secondary | ICD-10-CM | POA: Diagnosis not present

## 2017-04-11 DIAGNOSIS — Z23 Encounter for immunization: Secondary | ICD-10-CM | POA: Diagnosis not present

## 2017-04-11 DIAGNOSIS — E039 Hypothyroidism, unspecified: Secondary | ICD-10-CM | POA: Diagnosis not present

## 2017-04-11 DIAGNOSIS — I10 Essential (primary) hypertension: Secondary | ICD-10-CM | POA: Diagnosis not present

## 2017-04-15 ENCOUNTER — Telehealth: Payer: Self-pay | Admitting: Endocrinology

## 2017-04-15 NOTE — Telephone Encounter (Signed)
Patient's wife called to advise that the patient is going to get the radiation and wanted [name] to be aware. Please advise.

## 2017-04-15 NOTE — Telephone Encounter (Signed)
See message to be advised.  

## 2017-04-15 NOTE — Telephone Encounter (Signed)
Ok, the next step is the blood test off the levothyroxine.

## 2017-04-15 NOTE — Telephone Encounter (Signed)
Patient notified

## 2017-04-17 ENCOUNTER — Other Ambulatory Visit (INDEPENDENT_AMBULATORY_CARE_PROVIDER_SITE_OTHER): Payer: BLUE CROSS/BLUE SHIELD

## 2017-04-17 ENCOUNTER — Other Ambulatory Visit: Payer: Self-pay | Admitting: Endocrinology

## 2017-04-17 DIAGNOSIS — E890 Postprocedural hypothyroidism: Secondary | ICD-10-CM

## 2017-04-17 LAB — TSH: TSH: 2.91 u[IU]/mL (ref 0.35–4.50)

## 2017-04-20 ENCOUNTER — Telehealth: Payer: Self-pay

## 2017-04-20 NOTE — Telephone Encounter (Signed)
Paper's placed on your desk to review.

## 2017-04-20 NOTE — Telephone Encounter (Signed)
Patient called regarding the disability paperwork, I advised patient that I could check with his nurse and she had not seen anything yet. I advised patient that I would look through the faxes for today and see if it was there. If it was not we would contact him to let him know.

## 2017-04-20 NOTE — Telephone Encounter (Signed)
Requested a call back from the patient to determine the dates for the forms.

## 2017-04-20 NOTE — Telephone Encounter (Signed)
I advised with MD's nurse Jinny Blossom B, who found the paperwork up front and placed it on MD's desk.

## 2017-04-20 NOTE — Telephone Encounter (Signed)
I need to know what dates pt is requesting.

## 2017-04-20 NOTE — Telephone Encounter (Signed)
Patient called requesting to speak to nurse about fax that was sent. Patient did not give specifics however, I transferred the call to Almyra Free to advise.

## 2017-04-21 NOTE — Telephone Encounter (Signed)
Form done and put in basket

## 2017-04-21 NOTE — Telephone Encounter (Signed)
Patient called back. He came out of work starting 04/20/2017. He states that it would range until you deem that he would be done. He is available at his mobile #

## 2017-04-21 NOTE — Telephone Encounter (Signed)
Please advise for patient's forms.  Thank you!

## 2017-04-21 NOTE — Telephone Encounter (Signed)
Noted, placed on Megan's desk.

## 2017-04-22 NOTE — Telephone Encounter (Signed)
Forms submitted the Jorge Ellis (fax [phone]) Patient notified and had no further questions at this time.

## 2017-04-22 NOTE — Telephone Encounter (Signed)
Pt wife calling to check on status of leave of abcense forms?  Thank you,  -LL

## 2017-04-24 ENCOUNTER — Other Ambulatory Visit (INDEPENDENT_AMBULATORY_CARE_PROVIDER_SITE_OTHER): Payer: BLUE CROSS/BLUE SHIELD

## 2017-04-24 ENCOUNTER — Telehealth: Payer: Self-pay | Admitting: *Deleted

## 2017-04-24 ENCOUNTER — Other Ambulatory Visit: Payer: Self-pay | Admitting: Endocrinology

## 2017-04-24 DIAGNOSIS — E890 Postprocedural hypothyroidism: Secondary | ICD-10-CM | POA: Diagnosis not present

## 2017-04-24 LAB — TSH: TSH: 15.92 u[IU]/mL — AB (ref 0.35–4.50)

## 2017-04-24 NOTE — Telephone Encounter (Signed)
Disability was faxed to [employer] and [employer] and FMLA was faxed to [facility]

## 2017-04-24 NOTE — Telephone Encounter (Signed)
Patient called back in to clarify that we are only sending this paperwork to [employer]. Please do not send to [facility] whitten

## 2017-04-24 NOTE — Telephone Encounter (Signed)
Patient dropped of Short term disability form. Will place form in [name]  box. Please fax to [employer] [phone]) and to [employer] [phone]). Please notify pt when  form is fax and he can pick up original copy.  Pt Contact number is [phone]. Please advise . Thank you

## 2017-04-27 ENCOUNTER — Telehealth: Payer: Self-pay | Admitting: Endocrinology

## 2017-04-27 ENCOUNTER — Telehealth: Payer: Self-pay | Admitting: Family Medicine

## 2017-04-27 NOTE — Telephone Encounter (Signed)
Patient called to get lab results. Call patient as soon as possible. Okay to leave a detailed message.

## 2017-04-27 NOTE — Telephone Encounter (Signed)
Patient called to ask if his short term disability was faxed over to his job, I was advised by Jinny Blossom B that they were faxed. Patient is aware. No further action required.

## 2017-04-27 NOTE — Telephone Encounter (Signed)
Notes recorded by [name], MD on 04/24/2017 at 7:20 PM EDT please call patient: Much better. Please recheck the blood test next week (week of 04/27/17). I have ordered. Patient notified of message above and voiced understanding. Pateint scheduled for labs on 05/01/2017.

## 2017-04-27 NOTE — Telephone Encounter (Signed)
Please call back to over lab results.  Thank you,  -LL

## 2017-05-01 ENCOUNTER — Other Ambulatory Visit (INDEPENDENT_AMBULATORY_CARE_PROVIDER_SITE_OTHER): Payer: BLUE CROSS/BLUE SHIELD

## 2017-05-01 ENCOUNTER — Other Ambulatory Visit: Payer: Self-pay | Admitting: Endocrinology

## 2017-05-01 ENCOUNTER — Telehealth: Payer: Self-pay

## 2017-05-01 DIAGNOSIS — E890 Postprocedural hypothyroidism: Secondary | ICD-10-CM

## 2017-05-01 DIAGNOSIS — C73 Malignant neoplasm of thyroid gland: Secondary | ICD-10-CM

## 2017-05-01 LAB — TSH: TSH: 41.69 u[IU]/mL — ABNORMAL HIGH (ref 0.35–4.50)

## 2017-05-01 NOTE — Telephone Encounter (Signed)
done

## 2017-05-01 NOTE — Telephone Encounter (Signed)
On 04/24/2017 short term disability paper work had been dropped off by the patient to be faxed the [place]. The paper work was submitted to [employer] without the MD's portion completed. [name] I have placed the forms on your desk to review and sign.

## 2017-05-01 NOTE — Telephone Encounter (Signed)
Paper work faxed to [employer] and patient notified via voicemail. Patient also notified his copy is ready for pick up. Paper work placed upfront.

## 2017-05-04 ENCOUNTER — Telehealth: Payer: Self-pay

## 2017-05-04 NOTE — Telephone Encounter (Signed)
Patient's wife called asking if the patient needed to d/c the liothyronine before his appointment on the 24 th. [name] advised verbally for the patient to discontinue 24 hrs prior. She voiced understanding.

## 2017-05-04 NOTE — Telephone Encounter (Signed)
Patient notified of message and voiced understanding.  

## 2017-05-04 NOTE — Telephone Encounter (Signed)
We received a phone call from the nuc med department and the Whole Body scan has been scheduled for 05/19/2017 at 9 am and the the patient will return for the second part on 05/22/2017 at 700 am. Patient stated at this time he feels terrible and does not know how he can go without thyroid medication until his appointment. Patient stated his HR is at 60 bpm, body temp is low (he is cold to the touch) and his mind comes and goes. Patient wanted to know if he can take the low dosage of the thyroid medication now to help with these symptoms and if he cannot what can he do to help?

## 2017-05-04 NOTE — Telephone Encounter (Signed)
Patient's wife has additional questions for the nurse. Call patient's wife to advise.

## 2017-05-04 NOTE — Telephone Encounter (Signed)
It is ok to take 1 per day (of the liothyronine)

## 2017-05-08 ENCOUNTER — Telehealth: Payer: Self-pay

## 2017-05-08 NOTE — Telephone Encounter (Signed)
Patient called in with questions.  States that he received his short term disability papers from our office yesterday (anthem paperwork), and the date that he went out on the form is incorrect. He states that the date on the form is 6/29, and the correct date should be 6/25. He states this will be a week that is not paid and should be. He asks for a call back from [facility] to discuss.

## 2017-05-08 NOTE — Telephone Encounter (Signed)
Patient notified of message via voicemail.

## 2017-05-08 NOTE — Telephone Encounter (Signed)
See message,  Papers on your desk to review.

## 2017-05-08 NOTE — Telephone Encounter (Signed)
Based on the blood tests, the absolute earliest we can go is 6/29.

## 2017-05-19 ENCOUNTER — Encounter (HOSPITAL_COMMUNITY)
Admission: RE | Admit: 2017-05-19 | Discharge: 2017-05-19 | Disposition: A | Discharge status: Home / Self Care | Payer: BLUE CROSS/BLUE SHIELD | Source: Ambulatory Visit | Attending: Endocrinology | Admitting: Endocrinology

## 2017-05-19 DIAGNOSIS — C73 Malignant neoplasm of thyroid gland: Secondary | ICD-10-CM

## 2017-05-22 ENCOUNTER — Other Ambulatory Visit: Payer: Self-pay | Admitting: Endocrinology

## 2017-05-22 ENCOUNTER — Encounter (HOSPITAL_COMMUNITY)
Admission: RE | Admit: 2017-05-22 | Discharge: 2017-05-22 | Disposition: A | Discharge status: Home / Self Care | Payer: BLUE CROSS/BLUE SHIELD | Source: Ambulatory Visit | Attending: Endocrinology | Admitting: Endocrinology

## 2017-05-22 DIAGNOSIS — Z85850 Personal history of malignant neoplasm of thyroid: Secondary | ICD-10-CM | POA: Diagnosis not present

## 2017-05-22 DIAGNOSIS — C73 Malignant neoplasm of thyroid gland: Secondary | ICD-10-CM

## 2017-05-22 MED ORDER — LEVOTHYROXINE SODIUM 175 MCG PO TABS: 175.0000 ug | ORAL_TABLET | Freq: Every day | ORAL | 3 refills | Status: DC

## 2017-05-22 MED ORDER — SODIUM IODIDE I 131 CAPSULE
3.0000 | Freq: Once | INTRAVENOUS | Status: AC | PRN
  Administered 2017-05-21: 3 via ORAL

## 2017-05-23 ENCOUNTER — Encounter: Payer: Self-pay | Admitting: Endocrinology

## 2017-05-24 ENCOUNTER — Encounter (HOSPITAL_COMMUNITY): Payer: Self-pay | Admitting: *Deleted

## 2017-05-24 ENCOUNTER — Emergency Department (HOSPITAL_COMMUNITY)
Admission: EM | Admit: 2017-05-24 | Discharge: 2017-05-25 | Disposition: A | Discharge status: Home / Self Care | Payer: BLUE CROSS/BLUE SHIELD | Attending: Emergency Medicine | Admitting: Emergency Medicine

## 2017-05-24 ENCOUNTER — Other Ambulatory Visit: Payer: Self-pay

## 2017-05-24 DIAGNOSIS — R55 Syncope and collapse: Secondary | ICD-10-CM | POA: Diagnosis not present

## 2017-05-24 DIAGNOSIS — I10 Essential (primary) hypertension: Secondary | ICD-10-CM | POA: Diagnosis not present

## 2017-05-24 DIAGNOSIS — E890 Postprocedural hypothyroidism: Secondary | ICD-10-CM | POA: Diagnosis not present

## 2017-05-24 DIAGNOSIS — Z79899 Other long term (current) drug therapy: Secondary | ICD-10-CM | POA: Diagnosis not present

## 2017-05-24 DIAGNOSIS — N289 Disorder of kidney and ureter, unspecified: Secondary | ICD-10-CM | POA: Diagnosis not present

## 2017-05-24 DIAGNOSIS — M6281 Muscle weakness (generalized): Secondary | ICD-10-CM | POA: Insufficient documentation

## 2017-05-24 DIAGNOSIS — R531 Weakness: Secondary | ICD-10-CM | POA: Diagnosis not present

## 2017-05-24 LAB — BASIC METABOLIC PANEL
Anion gap: 8 (ref 5–15)
BUN: 12 mg/dL (ref 6–20)
CHLORIDE: 102 mmol/L (ref 101–111)
CO2: 27 mmol/L (ref 22–32)
Calcium: 9 mg/dL (ref 8.9–10.3)
Creatinine, Ser: 1.46 mg/dL — ABNORMAL HIGH (ref 0.61–1.24)
GFR calc non Af Amer: 54 mL/min — ABNORMAL LOW (ref >60)
Glucose, Bld: 108 mg/dL — ABNORMAL HIGH (ref 65–99)
Potassium: 3.6 mmol/L (ref 3.5–5.1)
Sodium: 137 mmol/L (ref 135–145)

## 2017-05-24 LAB — CBC
HEMATOCRIT: 46.8 % (ref 39.0–52.0)
HEMOGLOBIN: 16.4 g/dL (ref 13.0–17.0)
MCH: 30.7 pg (ref 26.0–34.0)
MCHC: 35 g/dL (ref 30.0–36.0)
MCV: 87.5 fL (ref 78.0–100.0)
Platelets: 160 10*3/uL (ref 150–400)
RBC: 5.35 MIL/uL (ref 4.22–5.81)
RDW: 13 % (ref 11.5–15.5)
WBC: 7.6 10*3/uL (ref 4.0–10.5)

## 2017-05-24 NOTE — Discharge Instructions (Signed)
We feel that you are having week episodes due to abnormal thyroid function. Call Dr. Cordelia Pen office in the morning to arrange for the next available office appointment. Ask to speak with [name] regarding possible adjustment of your medications. Return if concern for any reason read your blood pressure should be rechecked within the next one or 2 weeks. Today's was elevated at 153/109

## 2017-05-24 NOTE — ED Triage Notes (Signed)
Pt c/o confusion, weakness, passing out, low oxygen level at home for the past 3 weeks, had spoken with his endocrinologist who advised for pt to start taking liothyronine again but pt saw no improvement in symptoms, pt has hx of thyroid cancer and is currently taking radiation.
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
## 2017-05-24 NOTE — ED Notes (Signed)
ED Provider at bedside. 

## 2017-05-24 NOTE — ED Notes (Signed)
Pt & pt's family members informed of need for urine sample. Pt states he does not have to go at this time. Pt was provided with urinal.

## 2017-05-25 ENCOUNTER — Telehealth: Payer: Self-pay | Admitting: Endocrinology

## 2017-05-25 LAB — TSH: TSH: 75.213 u[IU]/mL — ABNORMAL HIGH (ref 0.350–4.500)

## 2017-05-25 NOTE — Telephone Encounter (Signed)
Spoke with pt and let him of the note.

## 2017-05-25 NOTE — Telephone Encounter (Signed)
See message. Would either of you be willing to see the patient during Dr. Cordelia Pen absence? Thanks!

## 2017-05-25 NOTE — Telephone Encounter (Signed)
please call patient: I heard you were at the ER yesterday.  Are you feeling better?  Which thyroid pill are you taking?

## 2017-05-25 NOTE — Telephone Encounter (Signed)
Per PCP 

## 2017-05-25 NOTE — Telephone Encounter (Signed)
Pt is calling for follow up on the results from friday

## 2017-05-25 NOTE — ED Notes (Signed)
Received report on pt, pt lying supine on stretcher, pt and family at bedside updated on plan of care,

## 2017-05-25 NOTE — Telephone Encounter (Signed)
He needs follow-up of his ER visit, please refer to primary care

## 2017-05-25 NOTE — Telephone Encounter (Signed)
Patient called in reference to being in ER last night. Oxygen levels dropping and BP going up.   Patient needs updated letter for disability to extend date. Patient needs this letter ASAP.   Please call patient and advise. OK to leave message.

## 2017-05-25 NOTE — Telephone Encounter (Signed)
[name], Could you please notify the patient of the Covering Md's response.

## 2017-05-25 NOTE — Telephone Encounter (Signed)
LM for pt to call back so I can let him know

## 2017-05-25 NOTE — Telephone Encounter (Signed)
Notes recorded by [name], MD on 05/22/2017 at 6:45 PM EDT please call patient: No focus of cancer is found. The next step is to check the ultrasound. you will receive a phone call, about a day and time for an appointment Also, you can resume the levothyroxine.  I contacted the patient and advised message from MD via voicemail

## 2017-05-26 ENCOUNTER — Telehealth: Payer: Self-pay | Admitting: Endocrinology

## 2017-05-26 NOTE — Telephone Encounter (Signed)
Patients wife stated that since his scan he has only been the 19mcg of the liothyronine? He is feeling better but his oxygen levels had dropped, he was not alert and he was very confused before going to the ER.

## 2017-05-26 NOTE — Telephone Encounter (Signed)
Have discussed with the pt and the pt's wife that this can and will be addressed once [name] returns to the office

## 2017-05-26 NOTE — Telephone Encounter (Signed)
Please resume the levothyroxine For the next week, he can also take the liothyronine.

## 2017-05-26 NOTE — Telephone Encounter (Signed)
Noted  

## 2017-05-26 NOTE — Telephone Encounter (Signed)
Patient needs a call from clinical staff as soon as possible about extending his date to 06/24/17 for his short term disability. "This must be done as soon as possible." Call patient to advise.

## 2017-05-26 NOTE — Telephone Encounter (Signed)
Called and notified patent to resume Levothyroxine.

## 2017-05-31 ENCOUNTER — Encounter: Payer: Self-pay | Admitting: Endocrinology

## 2017-06-01 NOTE — Telephone Encounter (Signed)
Patient is following-up on forms that were filled out for short term disability.  He states that he was written out until July 29th but is needing that extended.  Patient states that he has not heard about his ultrasound and is concerned about losing his job since it states he is to return to work on 7/29.  Patient can be reached back at [phone] or [phone]

## 2017-06-02 ENCOUNTER — Ambulatory Visit
Admission: RE | Admit: 2017-06-02 | Discharge: 2017-06-02 | Disposition: A | Discharge status: Home / Self Care | Payer: BLUE CROSS/BLUE SHIELD | Source: Ambulatory Visit | Attending: Endocrinology | Admitting: Endocrinology

## 2017-06-02 DIAGNOSIS — E041 Nontoxic single thyroid nodule: Secondary | ICD-10-CM | POA: Diagnosis not present

## 2017-06-02 DIAGNOSIS — C73 Malignant neoplasm of thyroid gland: Secondary | ICD-10-CM

## 2017-06-19 DIAGNOSIS — K2950 Unspecified chronic gastritis without bleeding: Secondary | ICD-10-CM | POA: Diagnosis not present

## 2017-06-26 DIAGNOSIS — L918 Other hypertrophic disorders of the skin: Secondary | ICD-10-CM | POA: Diagnosis not present

## 2017-06-26 DIAGNOSIS — D229 Melanocytic nevi, unspecified: Secondary | ICD-10-CM | POA: Diagnosis not present

## 2017-06-26 DIAGNOSIS — Z85828 Personal history of other malignant neoplasm of skin: Secondary | ICD-10-CM | POA: Diagnosis not present

## 2017-10-02 ENCOUNTER — Ambulatory Visit: Payer: BLUE CROSS/BLUE SHIELD | Admitting: Endocrinology

## 2017-10-02 ENCOUNTER — Encounter: Payer: Self-pay | Admitting: Endocrinology

## 2017-10-02 VITALS — BP 150/103 | HR 92 | Wt 244.2 lb

## 2017-10-02 DIAGNOSIS — E890 Postprocedural hypothyroidism: Secondary | ICD-10-CM | POA: Diagnosis not present

## 2017-10-02 DIAGNOSIS — C73 Malignant neoplasm of thyroid gland: Secondary | ICD-10-CM | POA: Diagnosis not present

## 2017-10-02 NOTE — Progress Notes (Signed)
Subjective:    Patient ID: [facility], male    DOB: Aug 17, 1966, 51 y.o.   MRN: [id number]  HPI Pt returns for MALIGNANT NEOPLASM OF THYROID GLAND (MEQ-683)  follicular variant of papillary thyroid cancer, stage-1 (due to age just less than 66, at time of Dx--he otherwise would have been stage-3).  5/10 thyroidectomy, T3 N1a M0     7/10 RAI 156 mci  7/10 post-therapy scan has uptake at the thyroid bed only  12/10: TG (off synthroid)=296 (ab neg) 1/11 RAI 195 mci  1/11 post-therapy scan pos at the neck  1/11 PET CT pos for node at left supraclavicular area  5/11 TG (on synthroid)=10.5 (ab borderline)  7/11 TG=354 (off synthroid) (ab neg)  9/11 TG (on synthroid)=12 (ab neg)  7/12: TG (on synthroid)=13 (ab neg) 12/12: PET CT: neg 6/13: TG (on synthroid)=15 (ab neg) 12/13 TG (on synthroid)=26 (ab neg) 12/13: neck US neg 6/14: TG (on synthroid)=20 (ab neg) 12/14: TG(on synthroid)=25 (ab neg) 6/15: TG (on synthroid)=20 (ab neg) 6/16: TG (on synthroid)=10 (ab neg) 12/16: TG (on synthroid)=9 (ab neg).  7/17: TG (on synthroid)=13 (ab neg).  12/17: TG (on synthroid)=7 (ab neg). 6/18: TG (on synthroid)=7 (ab neg). 7/18: I-131 body scan (off synthroid) is neg. He does not notice any nodule at the neck.  Postsurgical hypothyroidism: he keeps a slightly suppressed TSH for his cancer.  He now takes 200 mcg/d Past Medical History:  Diagnosis Date  . G E R D 05/02/2009  . Hypercholesteremia   . HYPERTENSION 05/02/2009  . Hypocalcemia 07/04/2010  . HYPOTHYROIDISM, POSTSURGICAL 05/02/2009  . Malignant neoplasm of thyroid gland ([place]) 01/25/9621   follicular variant of papillary thyroid cancer 5/10 thyroidectomy, t3 M1 no 7/10 I-131 156 mci 7/10 post-therapy scan has uptake at the thryoid bed only 1/11 I-131 195 mci 1/11 post-therapy scan pos at the neck 1/11 pet scan pos for node at left supraclavicular area 5/11 thyroglobulin (on synthroid)=10.5 (ab borderline) 7/11 thyroglobulin=354 (off synthroid)  (ab neg) 9/11 thyroglobulin =12 ab neg   . PERIPHERAL EDEMA 11/12/2009  . Sleep apnea     Past Surgical History:  Procedure Laterality Date  . COLONOSCOPY    . COLONOSCOPY N/A 12/27/2014   Procedure: COLONOSCOPY;  Surgeon: Rogene Houston, MD;  [facility];  Service: Endoscopy;  Laterality: N/A;  930  . TOTAL THYROIDECTOMY  Mar 02, 2009  . UVULOPALATOPHARYNGOPLASTY  2005    Social History   Socioeconomic History  . Marital status: Married    Spouse name: Not on file  . Number of children: Not on file  . Years of education: Not on file  . Highest education level: Not on file  Social Needs  . Financial resource strain: Not on file  . Food insecurity - worry: Not on file  . Food insecurity - inability: Not on file  . Transportation needs - medical: Not on file  . Transportation needs - non-medical: Not on file  Occupational History  . Occupation: Furniture conservator/restorer  Tobacco Use  . Smoking status: Never Smoker  . Smokeless tobacco: Former Network engineer and Sexual Activity  . Alcohol use: No  . Drug use: No  . Sexual activity: Not on file  Other Topics Concern  . Not on file  Social History Narrative  . Not on file    Current Outpatient Medications on File Prior to Visit  Medication Sig Dispense Refill  . acetaminophen (TYLENOL) 500 MG tablet Take 1,000 mg by mouth every  6 (six) hours as needed for moderate pain or headache.    . Cholecalciferol (VITAMIN D3) 5000 UNITS CAPS Take 1 tablet by mouth daily.    Marland Kitchen lisinopril (PRINIVIL,ZESTRIL) 40 MG tablet Take 40 mg by mouth daily.     . ranitidine (ZANTAC) 150 MG tablet Take 150 mg by mouth 2 (two) times daily as needed.     . vitamin B-12 (CYANOCOBALAMIN) 1000 MCG tablet Take 1,000 mcg by mouth daily.     No current facility-administered medications on file prior to visit.     No Known Allergies  Family History  Problem Relation Age of Onset  . Thyroid disease Mother   . Thyroid disease Sister        had  thyroidectomy (benign)    BP (!) 150/103 (BP Location: Left Arm, Patient Position: Sitting, Cuff Size: Normal)   Pulse 92   Wt 244 lb 3.2 oz (110.8 kg)   SpO2 97%   BMI 31.35 kg/m    Review of Systems Denies neck pain    Objective:   Physical Exam VITAL SIGNS:  See vs page GENERAL: no distress Neck: a healed scar is present.  I do not appreciate a nodule in the thyroid or elsewhere in the neck      Assessment & Plan:  Stage 1 differentiated thyroid cancer, with persistently detectable TG.  We discussed possible repeat RAI rx, and he is OK with it.  postsurgical hypothyroidism, due for recheck. Goal TSH is slightly low or borderline low.  HTN: recheck with PCP  Patient Instructions  blood tests are requested for you today.  We'll let you know about the results. If the blood test is much worse, we can consider a repeat radioactive iodine pill.   i'll refill the levothyroxine based on the blood test result Please come back for a follow-up appointment in 6 months

## 2017-10-02 NOTE — Patient Instructions (Addendum)
blood tests are requested for you today.  We'll let you know about the results. If the blood test is much worse, we can consider a repeat radioactive iodine pill.   i'll refill the levothyroxine based on the blood test result Please come back for a follow-up appointment in 6 months

## 2017-10-06 LAB — THYROGLOBULIN LEVEL: Thyroglobulin: 5.2 ng/mL

## 2017-10-06 LAB — THYROGLOBULIN ANTIBODY

## 2017-10-06 LAB — TSH: TSH: 0.67 mIU/L (ref 0.40–4.50)

## 2017-10-07 ENCOUNTER — Telehealth: Payer: Self-pay | Admitting: Endocrinology

## 2017-10-07 ENCOUNTER — Other Ambulatory Visit: Payer: Self-pay

## 2017-10-07 MED ORDER — LEVOTHYROXINE SODIUM 200 MCG PO TABS: 200.0000 ug | ORAL_TABLET | Freq: Every day | ORAL | 3 refills | Status: DC

## 2017-10-07 NOTE — Telephone Encounter (Signed)
Ok, I have sent a prescription to your pharmacy 

## 2017-10-07 NOTE — Telephone Encounter (Signed)
This medication isn't on current med list? Should this be sent in?

## 2017-10-07 NOTE — Telephone Encounter (Signed)
Pt called was in office on 10/02/17 Pt wife request script for  levothyroxine (SYNTHROID, LEVOTHROID) 200 MCG table  Send to  [facility] drug

## 2017-10-08 NOTE — Telephone Encounter (Signed)
Pt's wife is aware the rx was called into [facility]

## 2017-11-14 DIAGNOSIS — R739 Hyperglycemia, unspecified: Secondary | ICD-10-CM | POA: Diagnosis not present

## 2017-11-14 DIAGNOSIS — E039 Hypothyroidism, unspecified: Secondary | ICD-10-CM | POA: Diagnosis not present

## 2017-11-14 DIAGNOSIS — K219 Gastro-esophageal reflux disease without esophagitis: Secondary | ICD-10-CM | POA: Diagnosis not present

## 2017-11-14 DIAGNOSIS — I10 Essential (primary) hypertension: Secondary | ICD-10-CM | POA: Diagnosis not present

## 2017-11-14 DIAGNOSIS — E782 Mixed hyperlipidemia: Secondary | ICD-10-CM | POA: Diagnosis not present

## 2017-11-21 DIAGNOSIS — M10071 Idiopathic gout, right ankle and foot: Secondary | ICD-10-CM | POA: Diagnosis not present

## 2017-11-21 DIAGNOSIS — I10 Essential (primary) hypertension: Secondary | ICD-10-CM | POA: Diagnosis not present

## 2017-11-21 DIAGNOSIS — E782 Mixed hyperlipidemia: Secondary | ICD-10-CM | POA: Diagnosis not present

## 2017-11-21 DIAGNOSIS — E039 Hypothyroidism, unspecified: Secondary | ICD-10-CM | POA: Diagnosis not present

## 2018-01-19 DIAGNOSIS — D229 Melanocytic nevi, unspecified: Secondary | ICD-10-CM | POA: Diagnosis not present

## 2018-01-19 DIAGNOSIS — L570 Actinic keratosis: Secondary | ICD-10-CM | POA: Diagnosis not present

## 2018-02-24 ENCOUNTER — Ambulatory Visit: Payer: BLUE CROSS/BLUE SHIELD | Admitting: Endocrinology

## 2018-02-24 ENCOUNTER — Encounter: Payer: Self-pay | Admitting: Endocrinology

## 2018-02-24 VITALS — BP 140/94 | HR 82 | Wt 244.0 lb

## 2018-02-24 DIAGNOSIS — E890 Postprocedural hypothyroidism: Secondary | ICD-10-CM | POA: Diagnosis not present

## 2018-02-24 DIAGNOSIS — C73 Malignant neoplasm of thyroid gland: Secondary | ICD-10-CM

## 2018-02-24 NOTE — Patient Instructions (Addendum)
blood tests are requested for you today.  We'll let you know about the results. If the blood test is much worse, we can reconsider a repeat radioactive iodine pill.   Please come back for a follow-up appointment in 6 months.   

## 2018-02-24 NOTE — Progress Notes (Signed)
Subjective:    Patient ID: Jorge Ellis, male    DOB: December 30, 1965, 52 y.o.   MRN: [id number]  HPI Pt returns for MALIGNANT NEOPLASM OF THYROID GLAND (PNT-614)  follicular variant of papillary thyroid cancer, stage-1 (due to age just less than 41, at time of Dx--he otherwise would have been stage-3).  5/10 thyroidectomy, T3 N1a M0     7/10 RAI 156 mci  7/10 post-therapy scan has uptake at the thyroid bed only  12/10: TG (off synthroid)=296 (ab neg) 1/11 RAI 195 mci  1/11 post-therapy scan pos at the neck  1/11 PET CT pos for node at left supraclavicular area  5/11 TG (on synthroid)=10.5 (ab borderline)  7/11 TG=354 (off synthroid) (ab neg)  9/11 TG (on synthroid)=12 (ab neg)  7/12: TG (on synthroid)=13 (ab neg) 12/12: PET CT: neg 6/13: TG (on synthroid)=15 (ab neg) 12/13 TG (on synthroid)=26 (ab neg) 12/13: neck US neg 6/14: TG (on synthroid)=20 (ab neg) 12/14: TG(on synthroid)=25 (ab neg) 6/15: TG (on synthroid)=20 (ab neg) 6/16: TG (on synthroid)=10 (ab neg) 12/16: TG (on synthroid)=9 (ab neg).  7/17: TG (on synthroid)=13 (ab neg).  12/17: TG (on synthroid)=7 (ab neg). 6/18: TG (on synthroid)=7 (ab neg). 7/18: I-131 body scan (off synthroid) is neg. He does not notice any nodule at the neck.  Postsurgical hypothyroidism: he keeps a slightly suppressed TSH for his cancer.  He now takes 200 mcg/d.  He has slight tremor and insomnia.    Past Medical History:  Diagnosis Date  . G E R D 05/02/2009  . Hypercholesteremia   . HYPERTENSION 05/02/2009  . Hypocalcemia 07/04/2010  . HYPOTHYROIDISM, POSTSURGICAL 05/02/2009  . Malignant neoplasm of thyroid gland ([place]) 01/28/1539   follicular variant of papillary thyroid cancer 5/10 thyroidectomy, t3 M1 no 7/10 I-131 156 mci 7/10 post-therapy scan has uptake at the thryoid bed only 1/11 I-131 195 mci 1/11 post-therapy scan pos at the neck 1/11 pet scan pos for node at left supraclavicular area 5/11 thyroglobulin (on synthroid)=10.5 (ab  borderline) 7/11 thyroglobulin=354 (off synthroid) (ab neg) 9/11 thyroglobulin =12 ab neg   . PERIPHERAL EDEMA 11/12/2009  . Sleep apnea     Past Surgical History:  Procedure Laterality Date  . COLONOSCOPY    . COLONOSCOPY N/A 12/27/2014   Procedure: COLONOSCOPY;  Surgeon: Rogene Houston, MD;  Location: [facility];  Service: Endoscopy;  Laterality: N/A;  930  . TOTAL THYROIDECTOMY  Mar 02, 2009  . UVULOPALATOPHARYNGOPLASTY  2005    Social History   Socioeconomic History  . Marital status: Married    Spouse name: Not on file  . Number of children: Not on file  . Years of education: Not on file  . Highest education level: Not on file  Occupational History  . Occupation: Furniture conservator/restorer  Social Needs  . Financial resource strain: Not on file  . Food insecurity:    Worry: Not on file    Inability: Not on file  . Transportation needs:    Medical: Not on file    Non-medical: Not on file  Tobacco Use  . Smoking status: Never Smoker  . Smokeless tobacco: Former Network engineer and Sexual Activity  . Alcohol use: No  . Drug use: No  . Sexual activity: Not on file  Lifestyle  . Physical activity:    Days per week: Not on file    Minutes per session: Not on file  . Stress: Not on file  Relationships  . Social connections:  Talks on phone: Not on file    Gets together: Not on file    Attends religious service: Not on file    Active member of club or organization: Not on file    Attends meetings of clubs or organizations: Not on file    Relationship status: Not on file  . Intimate partner violence:    Fear of current or ex partner: Not on file    Emotionally abused: Not on file    Physically abused: Not on file    Forced sexual activity: Not on file  Other Topics Concern  . Not on file  Social History Narrative  . Not on file    Current Outpatient Medications on File Prior to Visit  Medication Sig Dispense Refill  . acetaminophen (TYLENOL) 500 MG tablet Take 1,000 mg  by mouth every 6 (six) hours as needed for moderate pain or headache.    . Cholecalciferol (VITAMIN D3) 5000 UNITS CAPS Take 1 tablet by mouth daily.    Marland Kitchen levothyroxine (SYNTHROID, LEVOTHROID) 200 MCG tablet Take 1 tablet (200 mcg total) by mouth daily before breakfast. 90 tablet 3  . lisinopril (PRINIVIL,ZESTRIL) 40 MG tablet Take 40 mg by mouth daily.     . niacin (NIASPAN) 1000 MG CR tablet Take 1,000 mg by mouth 2 (two) times daily.    . ranitidine (ZANTAC) 150 MG tablet Take 150 mg by mouth 2 (two) times daily as needed.     . vitamin B-12 (CYANOCOBALAMIN) 1000 MCG tablet Take 1,000 mcg by mouth daily.     No current facility-administered medications on file prior to visit.     No Known Allergies  Family History  Problem Relation Age of Onset  . Thyroid disease Mother   . Thyroid disease Sister        had thyroidectomy (benign)    BP (!) 140/94 (BP Location: Left Arm, Patient Position: Sitting, Cuff Size: Normal)   Pulse 82   Wt 244 lb (110.7 kg)   SpO2 97%   BMI 31.33 kg/m    Review of Systems No weight change.      Objective:   Physical Exam VITAL SIGNS:  See vs page GENERAL: no distress Neck: a healed scar is present.  I do not appreciate a nodule in the thyroid or elsewhere in the neck.    Lab Results  Component Value Date   TSH 0.44 02/24/2018      Assessment & Plan:  Thyroid cancer: strategy is to delay repeat RAI as much as possible. Postsurgical hypothyroidism: goal TSH is borderline low or slightly low, so please continue the same synthroid  Patient Instructions  blood tests are requested for you today.  We'll let you know about the results. If the blood test is much worse, we can reconsider a repeat radioactive iodine pill.   Please come back for a follow-up appointment in 6 months.

## 2018-02-25 LAB — THYROGLOBULIN LEVEL: Thyroglobulin: 6.9 ng/mL

## 2018-02-25 LAB — TSH: TSH: 0.44 u[IU]/mL (ref 0.35–4.50)

## 2018-02-25 LAB — THYROGLOBULIN ANTIBODY: Thyroglobulin Ab: <1 IU/mL (ref <=1)

## 2018-04-02 ENCOUNTER — Ambulatory Visit: Payer: BLUE CROSS/BLUE SHIELD | Admitting: Endocrinology

## 2018-05-08 DIAGNOSIS — Z0001 Encounter for general adult medical examination with abnormal findings: Secondary | ICD-10-CM | POA: Diagnosis not present

## 2018-05-08 DIAGNOSIS — M10071 Idiopathic gout, right ankle and foot: Secondary | ICD-10-CM | POA: Diagnosis not present

## 2018-05-08 DIAGNOSIS — I10 Essential (primary) hypertension: Secondary | ICD-10-CM | POA: Diagnosis not present

## 2018-05-08 DIAGNOSIS — E039 Hypothyroidism, unspecified: Secondary | ICD-10-CM | POA: Diagnosis not present

## 2018-05-08 DIAGNOSIS — E6609 Other obesity due to excess calories: Secondary | ICD-10-CM | POA: Diagnosis not present

## 2018-05-08 DIAGNOSIS — E782 Mixed hyperlipidemia: Secondary | ICD-10-CM | POA: Diagnosis not present

## 2018-05-08 DIAGNOSIS — Z1212 Encounter for screening for malignant neoplasm of rectum: Secondary | ICD-10-CM | POA: Diagnosis not present

## 2018-06-21 ENCOUNTER — Telehealth: Payer: Self-pay | Admitting: Endocrinology

## 2018-06-21 MED ORDER — LEVOTHYROXINE SODIUM 175 MCG PO TABS: 175.0000 ug | ORAL_TABLET | Freq: Every day | ORAL | 3 refills | Status: DC

## 2018-06-21 NOTE — Telephone Encounter (Signed)
Please advise on below  

## 2018-06-21 NOTE — Telephone Encounter (Signed)
levothyroxine (SYNTHROID, LEVOTHROID) 200 MCG tablet   Patient stated he would like lower his dosage due to his TSH level being  .254   Please advise

## 2018-06-21 NOTE — Telephone Encounter (Signed)
Ok, I have sent a prescription to your pharmacy 

## 2018-07-15 DIAGNOSIS — E039 Hypothyroidism, unspecified: Secondary | ICD-10-CM | POA: Diagnosis not present

## 2018-08-05 DIAGNOSIS — Z23 Encounter for immunization: Secondary | ICD-10-CM | POA: Diagnosis not present

## 2018-08-27 ENCOUNTER — Encounter: Payer: Self-pay | Admitting: Endocrinology

## 2018-08-27 ENCOUNTER — Ambulatory Visit: Payer: BLUE CROSS/BLUE SHIELD | Admitting: Endocrinology

## 2018-08-27 VITALS — BP 128/88 | HR 78 | Ht 74.0 in | Wt 256.4 lb

## 2018-08-27 DIAGNOSIS — C73 Malignant neoplasm of thyroid gland: Secondary | ICD-10-CM | POA: Diagnosis not present

## 2018-08-27 DIAGNOSIS — E890 Postprocedural hypothyroidism: Secondary | ICD-10-CM | POA: Diagnosis not present

## 2018-08-27 LAB — T4, FREE: Free T4: 1.23 ng/dL (ref 0.60–1.60)

## 2018-08-27 LAB — TSH: TSH: 1.66 u[IU]/mL (ref 0.35–4.50)

## 2018-08-27 MED ORDER — LEVOTHYROXINE SODIUM 200 MCG PO TABS: 200.0000 ug | ORAL_TABLET | Freq: Every day | ORAL | 3 refills | Status: DC

## 2018-08-27 NOTE — Patient Instructions (Signed)
blood tests are requested for you today.  We'll let you know about the results. If the blood test is much worse, we can reconsider a repeat radioactive iodine pill.   Please come back for a follow-up appointment in 6 months.

## 2018-08-27 NOTE — Progress Notes (Signed)
Subjective:    Patient ID: Jorge Ellis, male    DOB: 25-Jan-1966, 52 y.o.   MRN: [id number]  HPI Pt returns for MALIGNANT NEOPLASM OF THYROID GLAND (OMA-004)  follicular variant of papillary thyroid cancer, stage-1 (due to age just less than 34, at time of Dx--he otherwise would have been stage-3).  5/10 thyroidectomy, T3 N1a M0     7/10 RAI 156 mci  7/10 post-therapy scan has uptake at the thyroid bed only  12/10: TG (off synthroid)=296 (ab neg) 1/11 RAI 195 mci  1/11 post-therapy scan pos at the neck  1/11 PET CT pos for node at left supraclavicular area  5/11 TG (on synthroid)=10.5 (ab borderline).   7/11 TG=354 (off synthroid) (ab neg)  9/11 TG (on synthroid)=12 (ab neg)  7/12: TG (on synthroid)=13 (ab neg) 12/12: PET CT: neg 6/13: TG (on synthroid)=15 (ab neg) 12/13 TG (on synthroid)=26 (ab neg) 12/13: neck US neg 6/14: TG (on synthroid)=20 (ab neg) 12/14: TG(on synthroid)=25 (ab neg) 6/15: TG (on synthroid)=20 (ab neg) 6/16: TG (on synthroid)=10 (ab neg) 12/16: TG (on synthroid)=9 (ab neg).  7/17: TG (on synthroid)=13 (ab neg).  12/17: TG (on synthroid)=7 (ab neg). 6/18: TG (on synthroid)=7 (ab neg). 7/18: I-131 body scan (off synthroid) is neg. 5/19: TG (on synthroid)=7 (ab neg). Postsurgical hypothyroidism: he keeps a slightly suppressed TSH for his cancer.  Last month, synthroid was reduced to 175 mcg/day.  pt states he feels well in general. Past Medical History:  Diagnosis Date  . G E R D 05/02/2009  . Hypercholesteremia   . HYPERTENSION 05/02/2009  . Hypocalcemia 07/04/2010  . HYPOTHYROIDISM, POSTSURGICAL 05/02/2009  . Malignant neoplasm of thyroid gland ([place]) 03/04/9773   follicular variant of papillary thyroid cancer 5/10 thyroidectomy, t3 M1 no 7/10 I-131 156 mci 7/10 post-therapy scan has uptake at the thryoid bed only 1/11 I-131 195 mci 1/11 post-therapy scan pos at the neck 1/11 pet scan pos for node at left supraclavicular area 5/11 thyroglobulin (on  synthroid)=10.5 (ab borderline) 7/11 thyroglobulin=354 (off synthroid) (ab neg) 9/11 thyroglobulin =12 ab neg   . PERIPHERAL EDEMA 11/12/2009  . Sleep apnea     Past Surgical History:  Procedure Laterality Date  . COLONOSCOPY    . COLONOSCOPY N/A 12/27/2014   Procedure: COLONOSCOPY;  Surgeon: Rogene Houston, MD;  Location[facility];  Service: Endoscopy;  Laterality: N/A;  930  . TOTAL THYROIDECTOMY  Mar 02, 2009  . UVULOPALATOPHARYNGOPLASTY  2005    Social History   Socioeconomic History  . Marital status: Married    Spouse name: Not on file  . Number of children: Not on file  . Years of education: Not on file  . Highest education level: Not on file  Occupational History  . Occupation: Furniture conservator/restorer  Social Needs  . Financial resource strain: Not on file  . Food insecurity:    Worry: Not on file    Inability: Not on file  . Transportation needs:    Medical: Not on file    Non-medical: Not on file  Tobacco Use  . Smoking status: Never Smoker  . Smokeless tobacco: Former Network engineer and Sexual Activity  . Alcohol use: No  . Drug use: No  . Sexual activity: Not on file  Lifestyle  . Physical activity:    Days per week: Not on file    Minutes per session: Not on file  . Stress: Not on file  Relationships  . Social connections:  Talks on phone: Not on file    Gets together: Not on file    Attends religious service: Not on file    Active member of club or organization: Not on file    Attends meetings of clubs or organizations: Not on file    Relationship status: Not on file  . Intimate partner violence:    Fear of current or ex partner: Not on file    Emotionally abused: Not on file    Physically abused: Not on file    Forced sexual activity: Not on file  Other Topics Concern  . Not on file  Social History Narrative  . Not on file    Current Outpatient Medications on File Prior to Visit  Medication Sig Dispense Refill  . acetaminophen (TYLENOL) 500 MG  tablet Take 1,000 mg by mouth every 6 (six) hours as needed for moderate pain or headache.    . Cholecalciferol (VITAMIN D3) 5000 UNITS CAPS Take 1 tablet by mouth daily.    Marland Kitchen lisinopril (PRINIVIL,ZESTRIL) 40 MG tablet Take 40 mg by mouth daily.     . niacin (NIASPAN) 1000 MG CR tablet Take 1,000 mg by mouth 2 (two) times daily.    . ranitidine (ZANTAC) 150 MG tablet Take 150 mg by mouth 2 (two) times daily as needed.     . vitamin B-12 (CYANOCOBALAMIN) 1000 MCG tablet Take 1,000 mcg by mouth daily.     No current facility-administered medications on file prior to visit.     No Known Allergies  Family History  Problem Relation Age of Onset  . Thyroid disease Mother   . Thyroid disease Sister        had thyroidectomy (benign)    BP 128/88 (BP Location: Left Arm, Patient Position: Sitting, Cuff Size: Normal)   Pulse 78   Ht 6' 2"  (1.88 m)   Wt 256 lb 6.4 oz (116.3 kg)   SpO2 95%   BMI 32.92 kg/m    Review of Systems He does not notice any nodule at the neck    Objective:   Physical Exam VITAL SIGNS:  See vs page GENERAL: no distress Neck: a healed scar is present.  I do not appreciate a nodule in the thyroid or elsewhere in the neck   Lab Results  Component Value Date   TSH 1.66 08/27/2018      Assessment & Plan:  PTC: recheck labs today.  Hypothyroidism: she needs to increase synthroid back to 200 mcg/d.  I have sent a prescription to your pharmacy.   Patient Instructions  blood tests are requested for you today.  We'll let you know about the results. If the blood test is much worse, we can reconsider a repeat radioactive iodine pill.   Please come back for a follow-up appointment in 6 months.

## 2018-08-30 LAB — THYROGLOBULIN LEVEL: THYROGLOBULIN: 22.5 ng/mL

## 2018-08-30 LAB — THYROGLOBULIN ANTIBODY: Thyroglobulin Ab: <1 IU/mL (ref <=1)

## 2018-09-16 ENCOUNTER — Ambulatory Visit (INDEPENDENT_AMBULATORY_CARE_PROVIDER_SITE_OTHER): Payer: BLUE CROSS/BLUE SHIELD | Admitting: Internal Medicine

## 2018-09-20 ENCOUNTER — Encounter (INDEPENDENT_AMBULATORY_CARE_PROVIDER_SITE_OTHER): Payer: Self-pay | Admitting: Internal Medicine

## 2018-09-20 ENCOUNTER — Encounter (INDEPENDENT_AMBULATORY_CARE_PROVIDER_SITE_OTHER): Payer: Self-pay | Admitting: *Deleted

## 2018-09-20 ENCOUNTER — Ambulatory Visit (INDEPENDENT_AMBULATORY_CARE_PROVIDER_SITE_OTHER): Payer: BLUE CROSS/BLUE SHIELD | Admitting: Internal Medicine

## 2018-09-20 VITALS — BP 160/100 | HR 68 | Temp 98.5°F | Ht 74.0 in | Wt 253.6 lb

## 2018-09-20 DIAGNOSIS — R1310 Dysphagia, unspecified: Secondary | ICD-10-CM | POA: Insufficient documentation

## 2018-09-20 DIAGNOSIS — K219 Gastro-esophageal reflux disease without esophagitis: Secondary | ICD-10-CM

## 2018-09-20 DIAGNOSIS — R1319 Other dysphagia: Secondary | ICD-10-CM

## 2018-09-20 NOTE — Patient Instructions (Signed)
The risks of bleeding, perforation and infection were reviewed with patient.  

## 2018-09-20 NOTE — Progress Notes (Signed)
   Subjective:    Patient ID: Jorge Ellis, male    DOB: 1966-07-06, 52 y.o.   MRN: [id number]  HPI Referred by Dr. Quillian Quince for dysphagia/GERD/EGD. HE tells me at times he has some dysphagia. He points to his upper esophagus where food is lodging. Has had dysphagia x 4-5 yrs. Foods are slow to go down.  Gerd controlled better with the Omeprazole. States has had acid reflux for about 20 yrs.  Appetite is good. No weight loss.  BMs are normal. No melena or BRRB.  Underwent a thyroidectomy in May 2010.   Colonoscopy in March of 2016  (screenign) revealed Impression:  Examination performed to cecum. 3 mm polyp ablated via cold biopsy from hepatic flexure. Single small diverticulum at splenic flexure. Small external hemorrhoids. Biopsy: Polyp is no an adenoma.      Review of Systems Past Medical History:  Diagnosis Date  . G E R D 05/02/2009  . Hypercholesteremia   . HYPERTENSION 05/02/2009  . Hypocalcemia 07/04/2010  . HYPOTHYROIDISM, POSTSURGICAL 05/02/2009  . Malignant neoplasm of thyroid gland ([place]) 06/29/4267   follicular variant of papillary thyroid cancer 5/10 thyroidectomy, t3 M1 no 7/10 I-131 156 mci 7/10 post-therapy scan has uptake at the thryoid bed only 1/11 I-131 195 mci 1/11 post-therapy scan pos at the neck 1/11 pet scan pos for node at left supraclavicular area 5/11 thyroglobulin (on synthroid)=10.5 (ab borderline) 7/11 thyroglobulin=354 (off synthroid) (ab neg) 9/11 thyroglobulin =12 ab neg   . PERIPHERAL EDEMA 11/12/2009  . Sleep apnea     Past Surgical History:  Procedure Laterality Date  . COLONOSCOPY    . COLONOSCOPY N/A 12/27/2014   Procedure: COLONOSCOPY;  Surgeon: Rogene Houston, MD;  [facility];  Service: Endoscopy;  Laterality: N/A;  930  . TOTAL THYROIDECTOMY  Mar 02, 2009  . UVULOPALATOPHARYNGOPLASTY  2005    No Known Allergies  Current Outpatient Medications on File Prior to Visit  Medication Sig Dispense Refill  . acetaminophen (TYLENOL) 500  MG tablet Take 1,000 mg by mouth every 6 (six) hours as needed for moderate pain or headache.    . calcium-vitamin D 250-100 MG-UNIT tablet Take 1 tablet by mouth 2 (two) times daily.    Marland Kitchen levothyroxine (SYNTHROID) 200 MCG tablet Take 1 tablet (200 mcg total) by mouth daily before breakfast. 90 tablet 3  . lisinopril (PRINIVIL,ZESTRIL) 40 MG tablet Take 40 mg by mouth daily.     . Magnesium 100 MG TABS Take by mouth.    . niacin (NIASPAN) 1000 MG CR tablet Take 1,000 mg by mouth 2 (two) times daily.    Marland Kitchen omeprazole (PRILOSEC) 40 MG capsule Take 40 mg by mouth daily.     No current facility-administered medications on file prior to visit.         Objective:   Physical Exam Blood pressure (!) 160/100, pulse 68, temperature 98.5 F (36.9 C), height 6\' 2"  (1.88 m), weight 253 lb 9.6 oz (115 kg). Alert and oriented. Skin warm and dry. Oral mucosa is moist.   . Sclera anicteric, conjunctivae is pink. Thyroid not enlarged. No cervical lymphadenopathy. Lungs clear. Heart regular rate and rhythm.  Abdomen is soft. Bowel sounds are positive. No hepatomegaly. No abdominal masses felt. No tenderness.  No edema to lower extremities.           Assessment & Plan:  Dysphagia,GERD. The risks of bleeding, perforation and infection were reviewed with patient.

## 2018-11-03 ENCOUNTER — Other Ambulatory Visit: Payer: Self-pay

## 2018-11-03 ENCOUNTER — Encounter (HOSPITAL_COMMUNITY)
Admission: RE | Disposition: A | Discharge status: Home / Self Care | Payer: Self-pay | Source: Home / Self Care | Attending: Internal Medicine

## 2018-11-03 ENCOUNTER — Ambulatory Visit (HOSPITAL_COMMUNITY)
Admission: RE | Admit: 2018-11-03 | Discharge: 2018-11-03 | Disposition: A | Discharge status: Home / Self Care | Payer: BLUE CROSS/BLUE SHIELD | Attending: Internal Medicine | Admitting: Internal Medicine

## 2018-11-03 ENCOUNTER — Encounter (HOSPITAL_COMMUNITY): Payer: Self-pay

## 2018-11-03 DIAGNOSIS — G4730 Sleep apnea, unspecified: Secondary | ICD-10-CM | POA: Diagnosis not present

## 2018-11-03 DIAGNOSIS — K219 Gastro-esophageal reflux disease without esophagitis: Secondary | ICD-10-CM | POA: Insufficient documentation

## 2018-11-03 DIAGNOSIS — E890 Postprocedural hypothyroidism: Secondary | ICD-10-CM | POA: Diagnosis not present

## 2018-11-03 DIAGNOSIS — R1319 Other dysphagia: Secondary | ICD-10-CM | POA: Insufficient documentation

## 2018-11-03 DIAGNOSIS — K449 Diaphragmatic hernia without obstruction or gangrene: Secondary | ICD-10-CM | POA: Insufficient documentation

## 2018-11-03 DIAGNOSIS — Z79899 Other long term (current) drug therapy: Secondary | ICD-10-CM | POA: Diagnosis not present

## 2018-11-03 DIAGNOSIS — I10 Essential (primary) hypertension: Secondary | ICD-10-CM | POA: Diagnosis not present

## 2018-11-03 DIAGNOSIS — R1310 Dysphagia, unspecified: Secondary | ICD-10-CM | POA: Insufficient documentation

## 2018-11-03 DIAGNOSIS — Z85850 Personal history of malignant neoplasm of thyroid: Secondary | ICD-10-CM | POA: Diagnosis not present

## 2018-11-03 DIAGNOSIS — R1314 Dysphagia, pharyngoesophageal phase: Secondary | ICD-10-CM

## 2018-11-03 HISTORY — PX: ESOPHAGOGASTRODUODENOSCOPY: SHX5428

## 2018-11-03 HISTORY — PX: ESOPHAGEAL DILATION: SHX303

## 2018-11-03 SURGERY — EGD (ESOPHAGOGASTRODUODENOSCOPY)
Anesthesia: Moderate Sedation

## 2018-11-03 MED ORDER — SODIUM CHLORIDE 0.9 % IV SOLN
INTRAVENOUS | Status: DC
  Administered 2018-11-03: 07:00:00 via INTRAVENOUS

## 2018-11-03 MED ORDER — MEPERIDINE HCL 50 MG/ML IJ SOLN
INTRAMUSCULAR | Status: DC | PRN
  Administered 2018-11-03 (×2): 25 mg

## 2018-11-03 MED ORDER — MIDAZOLAM HCL 5 MG/5ML IJ SOLN
INTRAMUSCULAR | Status: AC
  Filled 2018-11-03: qty 10

## 2018-11-03 MED ORDER — LIDOCAINE VISCOUS HCL 2 % MT SOLN
OROMUCOSAL | Status: AC
  Filled 2018-11-03: qty 15

## 2018-11-03 MED ORDER — MEPERIDINE HCL 50 MG/ML IJ SOLN
INTRAMUSCULAR | Status: AC
  Filled 2018-11-03: qty 1

## 2018-11-03 MED ORDER — MIDAZOLAM HCL 5 MG/5ML IJ SOLN
INTRAMUSCULAR | Status: DC | PRN
  Administered 2018-11-03 (×2): 2 mg via INTRAVENOUS

## 2018-11-03 MED ORDER — LIDOCAINE VISCOUS HCL 2 % MT SOLN
OROMUCOSAL | Status: DC | PRN
  Administered 2018-11-03: 1 via OROMUCOSAL

## 2018-11-03 NOTE — Op Note (Signed)
[facility] Patient Name: Jorge Ellis Procedure Date: 11/03/2018 7:07 AM MRN: [id number] Date of Birth: 1966-02-12 Attending MD: Hildred Laser , MD CSN: [id number] Age: 53 Admit Type: Outpatient Procedure:                Upper GI endoscopy Indications:              Esophageal dysphagia Providers:                Hildred Laser, MD, Janeece Riggers, RN, Aram Candela Referring MD:             Mitzie Na. Quillian Quince, MD Medicines:                Lidocaine spray, Meperidine 50 mg IV, Midazolam 4                            mg IV Complications:            No immediate complications. Estimated Blood Loss:     Estimated blood loss: none. Procedure:                Pre-Anesthesia Assessment:                           - Prior to the procedure, a History and Physical                            was performed, and patient medications and                            allergies were reviewed. The patient's tolerance of                            previous anesthesia was also reviewed. The risks                            and benefits of the procedure and the sedation                            options and risks were discussed with the patient.                            All questions were answered, and informed consent                            was obtained. Prior Anticoagulants: The patient has                            taken no previous anticoagulant or antiplatelet                            agents. ASA Grade Assessment: II - A patient with                            mild systemic disease. After reviewing the risks  and benefits, the patient was deemed in                            satisfactory condition to undergo the procedure.                           After obtaining informed consent, the endoscope was                            passed under direct vision. Throughout the                            procedure, the patient's blood pressure, pulse, and                             oxygen saturations were monitored continuously. The                            GIF-H190 ([id number]) scope was introduced through the                            mouth, and advanced to the second part of duodenum.                            The upper GI endoscopy was accomplished without                            difficulty. The patient tolerated the procedure                            well. Scope In: 7:41:37 AM Scope Out: 7:48:01 AM Total Procedure Duration: 0 hours 6 minutes 24 seconds  Findings:      The examined esophagus was normal.      The Z-line was regular and was found 40 cm from the incisors.      A 2 cm hiatal hernia was present.      No endoscopic abnormality was evident in the esophagus to explain the       patient's complaint of dysphagia. It was decided, however, to proceed       with dilation of the entire esophagus. The scope was withdrawn. Dilation       was performed with a Maloney dilator with no resistance at 49 Fr. The       dilation site was examined following endoscope reinsertion and showed no       change and no bleeding, mucosal tear or perforation.      The entire examined stomach was normal.      The duodenal bulb and second portion of the duodenum were normal. Impression:               - Normal esophagus.                           - Z-line regular, 40 cm from the incisors.                           -  2 cm hiatal hernia.                           - No endoscopic esophageal abnormality to explain                            patient's dysphagia. Esophagus dilated. Dilated.                           - Normal stomach.                           - Normal duodenal bulb and second portion of the                            duodenum.                           - No specimens collected. Moderate Sedation:      Moderate (conscious) sedation was administered by the endoscopy nurse       and supervised by the endoscopist. The following parameters were       monitored:  oxygen saturation, heart rate, blood pressure, CO2       capnography and response to care. Total physician intraservice time was       12 minutes. Recommendation:           - Patient has a contact number available for                            emergencies. The signs and symptoms of potential                            delayed complications were discussed with the                            patient. Return to normal activities tomorrow.                            Written discharge instructions were provided to the                            patient.                           - Resume previous diet today.                           - Continue present medications.                           - Telephone GI clinic in 3 months. Procedure Code(s):        --- Professional ---                           [id number], Esophagogastroduodenoscopy, flexible,  transoral; diagnostic, including collection of                            specimen(s) by brushing or washing, when performed                            (separate procedure)                           43450, Dilation of esophagus, by unguided sound or                            bougie, single or multiple passes                           G0500, Moderate sedation services provided by the                            same physician or other qualified health care                            professional performing a gastrointestinal                            endoscopic service that sedation supports,                            requiring the presence of an independent trained                            observer to assist in the monitoring of the                            patient's level of consciousness and physiological                            status; initial 15 minutes of intra-service time;                            patient age 51 years or older (additional time may                            be reported with [id number], as  appropriate) Diagnosis Code(s):        --- Professional ---                           K44.9, Diaphragmatic hernia without obstruction or                            gangrene                           R13.14, Dysphagia, pharyngoesophageal phase CPT copyright 2018 [employer]. All rights reserved. The codes documented in this report are preliminary and upon coder review may  be revised to meet current  compliance requirements. Hildred Laser, MD Hildred Laser, MD 11/03/2018 7:59:23 AM This report has been signed electronically. Number of Addenda: 0

## 2018-11-03 NOTE — Discharge Instructions (Signed)
Resume usual medications and diet as before. No driving for 24 hours. Please call office with progress report in 3 months.   Upper Endoscopy, Adult, Care After This sheet gives you information about how to care for yourself after your procedure. Your health care provider may also give you more specific instructions. If you have problems or questions, contact your health care provider. What can I expect after the procedure? After the procedure, it is common to have:  A sore throat.  Mild stomach pain or discomfort.  Bloating.  Nausea. Follow these instructions at home:   Follow instructions from your health care provider about what to eat or drink after your procedure.  Return to your normal activities as told by your health care provider. Ask your health care provider what activities are safe for you.  Take over-the-counter and prescription medicines only as told by your health care provider.  Do not drive for 24 hours if you were given a sedative during your procedure.  Keep all follow-up visits as told by your health care provider. This is important. Contact a health care provider if you have:  A sore throat that lasts longer than one day.  Trouble swallowing. Get help right away if:  You vomit blood or your vomit looks like coffee grounds.  You have: ? A fever. ? Bloody, black, or tarry stools. ? A severe sore throat or you cannot swallow. ? Difficulty breathing. ? Severe pain in your chest or abdomen. Summary  After the procedure, it is common to have a sore throat, mild stomach discomfort, bloating, and nausea.  Do not drive for 24 hours if you were given a sedative during the procedure.  Follow instructions from your health care provider about what to eat or drink after your procedure.  Return to your normal activities as told by your health care provider. This information is not intended to replace advice given to you by your health care provider. Make sure  you discuss any questions you have with your health care provider. Document Released: 04/13/2012 Document Revised: 03/15/2018 Document Reviewed: 03/15/2018 Elsevier Interactive Patient Education  2019 [employer]  Hiatal Hernia  A hiatal hernia occurs when part of the stomach slides above the muscle that separates the abdomen from the chest (diaphragm). A person can be born with a hiatal hernia (congenital), or it may develop over time. In almost all cases of hiatal hernia, only the top part of the stomach pushes through the diaphragm. Many people have a hiatal hernia with no symptoms. The larger the hernia, the more likely it is that you will have symptoms. In some cases, a hiatal hernia allows stomach acid to flow back into the tube that carries food from your mouth to your stomach (esophagus). This may cause heartburn symptoms. Severe heartburn symptoms may mean that you have developed a condition called gastroesophageal reflux disease (GERD). What are the causes? This condition is caused by a weakness in the opening (hiatus) where the esophagus passes through the diaphragm to attach to the upper part of the stomach. A person may be born with a weakness in the hiatus, or a weakness can develop over time. What increases the risk? This condition is more likely to develop in:  Older people. Age is a major risk factor for a hiatal hernia, especially if you are over the age of 39.  Pregnant women.  People who are overweight.  People who have frequent constipation. What are the signs or symptoms? Symptoms of this  condition usually develop in the form of GERD symptoms. Symptoms include:  Heartburn.  Belching.  Indigestion.  Trouble swallowing.  Coughing or wheezing.  Sore throat.  Hoarseness.  Chest pain.  Nausea and vomiting. How is this diagnosed? This condition may be diagnosed during testing for GERD. Tests that may be done include:  X-rays of your stomach or  chest.  An upper gastrointestinal (GI) series. This is an X-ray exam of your GI tract that is taken after you swallow a chalky liquid that shows up clearly on the X-ray.  Endoscopy. This is a procedure to look into your stomach using a thin, flexible tube that has a tiny camera and light on the end of it. How is this treated? This condition may be treated by:  Dietary and lifestyle changes to help reduce GERD symptoms.  Medicines. These may include: ? Over-the-counter antacids. ? Medicines that make your stomach empty more quickly. ? Medicines that block the production of stomach acid (H2 blockers). ? Stronger medicines to reduce stomach acid (proton pump inhibitors).  Surgery to repair the hernia, if other treatments are not helping. If you have no symptoms, you may not need treatment. Follow these instructions at home: Lifestyle and activity  Do not use any products that contain nicotine or tobacco, such as cigarettes and e-cigarettes. If you need help quitting, ask your health care provider.  Try to achieve and maintain a healthy body weight.  Avoid putting pressure on your abdomen. Anything that puts pressure on your abdomen increases the amount of acid that may be pushed up into your esophagus. ? Avoid bending over, especially after eating. ? Raise the head of your bed by putting blocks under the legs. This keeps your head and esophagus higher than your stomach. ? Do not wear tight clothing around your chest or stomach. ? Try not to strain when having a bowel movement, when urinating, or when lifting heavy objects. Eating and drinking  Avoid foods that can worsen GERD symptoms. These may include: ? Fatty foods, like fried foods. ? Citrus fruits, like oranges or lemon. ? Other foods and drinks that contain acid, like orange juice or tomatoes. ? Spicy food. ? Chocolate.  Eat frequent small meals instead of three large meals a day. This helps prevent your stomach from getting  too full. ? Eat slowly. ? Do not lie down right after eating. ? Do not eat 1-2 hours before bed.  Do not drink beverages with caffeine. These include cola, coffee, cocoa, and tea.  Do not drink alcohol. General instructions  Take over-the-counter and prescription medicines only as told by your health care provider.  Keep all follow-up visits as told by your health care provider. This is important. Contact a health care provider if:  Your symptoms are not controlled with medicines or lifestyle changes.  You are having trouble swallowing.  You have coughing or wheezing that will not go away. Get help right away if:  Your pain is getting worse.  Your pain spreads to your arms, neck, jaw, teeth, or back.  You have shortness of breath.  You sweat for no reason.  You feel sick to your stomach (nauseous) or you vomit.  You vomit blood.  You have bright red blood in your stools.  You have black, tarry stools. This information is not intended to replace advice given to you by your health care provider. Make sure you discuss any questions you have with your health care provider. Document Released: 01/03/2004 Document Revised:  05/18/2017 Document Reviewed: 05/18/2017 Elsevier Interactive Patient Education  [employer].

## 2018-11-03 NOTE — H&P (Signed)
Jorge Ellis is an 53 y.o. male.   Chief Complaint: Patient is here for EGD and EGD. HPI: Patient is 53 year old Caucasian male with 20-year history of GERD who presents with 3-year history of intermittent dysphagia to solids.  Points to the suprasternal area as site of bolus obstruction.  He states food bolus eventually passes down with few sips of water.  He is never had an episode of food impaction and regurgitation.  He says heartburn is well controlled with therapy.  He denies painful swallowing nausea vomiting abdominal pain or melena.  Past Medical History:  Diagnosis Date  . G E R D 05/02/2009  . Hypercholesteremia   . HYPERTENSION 05/02/2009  . Hypocalcemia 07/04/2010  . HYPOTHYROIDISM, POSTSURGICAL 05/02/2009  . Malignant neoplasm of thyroid gland ([place]) 4/0/1027   follicular variant of papillary thyroid cancer 5/10 thyroidectomy, t3 M1 no 7/10 I-131 156 mci 7/10 post-therapy scan has uptake at the thryoid bed only 1/11 I-131 195 mci 1/11 post-therapy scan pos at the neck 1/11 pet scan pos for node at left supraclavicular area 5/11 thyroglobulin (on synthroid)=10.5 (ab borderline) 7/11 thyroglobulin=354 (off synthroid) (ab neg) 9/11 thyroglobulin =12 ab neg   . PERIPHERAL EDEMA 11/12/2009  . Sleep apnea     Past Surgical History:  Procedure Laterality Date  . COLONOSCOPY    . COLONOSCOPY N/A 12/27/2014   Procedure: COLONOSCOPY;  Surgeon: Rogene Houston, MD;  [facility];  Service: Endoscopy;  Laterality: N/A;  930  . TOTAL THYROIDECTOMY  Mar 02, 2009  . UVULOPALATOPHARYNGOPLASTY  2005    Family History  Problem Relation Age of Onset  . Thyroid disease Mother   . Thyroid disease Sister        had thyroidectomy (benign)   Social History:  reports that he has never smoked. He has quit using smokeless tobacco. He reports that he does not drink alcohol or use drugs.  Allergies:  Allergies  Allergen Reactions  . Alpha-Gal     Beef, lamb, and pork    Medications Prior to  Admission  Medication Sig Dispense Refill  . acetaminophen (TYLENOL) 500 MG tablet Take 1,000 mg by mouth daily as needed for moderate pain or headache.     . Calcium-Magnesium-Zinc (CAL-MAG-ZINC PO) Take 2 tablets by mouth at bedtime.    . diphenhydramine-acetaminophen (TYLENOL PM) 25-500 MG TABS tablet Take 1 tablet by mouth at bedtime as needed (sleep).    Marland Kitchen levothyroxine (SYNTHROID) 200 MCG tablet Take 1 tablet (200 mcg total) by mouth daily before breakfast. 90 tablet 3  . lisinopril (PRINIVIL,ZESTRIL) 20 MG tablet Take 40 mg by mouth daily.    . Magnesium 400 MG TABS Take 400 mg by mouth daily.    . Melatonin 5 MG CAPS Take 5 mg by mouth at bedtime as needed (sleep).    . niacin 500 MG tablet Take 1,000 mg by mouth 2 (two) times daily.    Marland Kitchen omeprazole (PRILOSEC) 20 MG capsule Take 20 mg by mouth daily.    . vitamin B-12 (CYANOCOBALAMIN) 1000 MCG tablet Take 1,000 mcg by mouth daily.      No results found for this or any previous visit (from the past 48 hour(s)). No results found.  ROS  Blood pressure (!) 151/97, pulse 74, temperature 98.5 F (36.9 C), temperature source Oral, resp. rate 17, SpO2 99 %. Physical Exam  Constitutional: He appears well-developed and well-nourished.  HENT:  Mouth/Throat: Oropharynx is clear and moist.  Eyes: Conjunctivae are normal. No  scleral icterus.  Neck: No thyromegaly present.  Thyroid collar scar.  Cardiovascular: Normal rate, regular rhythm and normal heart sounds.  No murmur heard. Respiratory: Effort normal and breath sounds normal.  GI:  Abdomen is full but soft and nontender with organomegaly or masses.  Musculoskeletal:        General: No edema.  Lymphadenopathy:    He has no cervical adenopathy.  Neurological: He is alert.  Skin: Skin is warm and dry.     Assessment/Plan Esophageal dysphagia. Chronic GERD. EGD with ED.  Hildred Laser, MD 11/03/2018, 7:31 AM

## 2018-11-08 ENCOUNTER — Encounter (HOSPITAL_COMMUNITY): Payer: Self-pay | Admitting: Internal Medicine

## 2018-11-20 DIAGNOSIS — E782 Mixed hyperlipidemia: Secondary | ICD-10-CM | POA: Diagnosis not present

## 2018-11-20 DIAGNOSIS — I10 Essential (primary) hypertension: Secondary | ICD-10-CM | POA: Diagnosis not present

## 2018-11-20 DIAGNOSIS — E039 Hypothyroidism, unspecified: Secondary | ICD-10-CM | POA: Diagnosis not present

## 2018-11-20 DIAGNOSIS — K219 Gastro-esophageal reflux disease without esophagitis: Secondary | ICD-10-CM | POA: Diagnosis not present

## 2018-11-23 DIAGNOSIS — E039 Hypothyroidism, unspecified: Secondary | ICD-10-CM | POA: Diagnosis not present

## 2018-11-23 DIAGNOSIS — I10 Essential (primary) hypertension: Secondary | ICD-10-CM | POA: Diagnosis not present

## 2018-11-23 DIAGNOSIS — E782 Mixed hyperlipidemia: Secondary | ICD-10-CM | POA: Diagnosis not present

## 2018-11-23 DIAGNOSIS — M10071 Idiopathic gout, right ankle and foot: Secondary | ICD-10-CM | POA: Diagnosis not present

## 2019-02-28 ENCOUNTER — Other Ambulatory Visit: Payer: Self-pay

## 2019-02-28 ENCOUNTER — Encounter: Payer: Self-pay | Admitting: Endocrinology

## 2019-02-28 ENCOUNTER — Ambulatory Visit: Payer: BLUE CROSS/BLUE SHIELD | Admitting: Endocrinology

## 2019-02-28 VITALS — BP 130/88 | HR 56 | Ht 74.0 in | Wt 252.8 lb

## 2019-02-28 DIAGNOSIS — E890 Postprocedural hypothyroidism: Secondary | ICD-10-CM

## 2019-02-28 DIAGNOSIS — C73 Malignant neoplasm of thyroid gland: Secondary | ICD-10-CM | POA: Diagnosis not present

## 2019-02-28 LAB — TSH: TSH: 0.15 u[IU]/mL — ABNORMAL LOW (ref 0.35–4.50)

## 2019-02-28 LAB — T4, FREE: Free T4: 1.55 ng/dL (ref 0.60–1.60)

## 2019-02-28 NOTE — Patient Instructions (Addendum)
blood tests are requested for you today.  We'll let you know about the results.   Please come back for a follow-up appointment in 6 months.  

## 2019-02-28 NOTE — Progress Notes (Signed)
Subjective:    Patient ID: Jorge Ellis, male    DOB: 07-Aug-1966, 53 y.o.   MRN: [id number]  HPI Pt returns for MALIGNANT NEOPLASM OF THYROID GLAND (IRS-854)  follicular variant of papillary thyroid cancer, stage-1 (due to age just less than 58, at time of Dx--he otherwise would have been stage-3).  5/10 thyroidectomy, T3 N1a M0     7/10 RAI 156 mci  7/10 post-therapy scan has uptake at the thyroid bed only  12/10: TG (off synthroid)=296 (ab neg) 1/11 RAI 195 mci  1/11 post-therapy scan pos at the neck  1/11 PET CT pos for node at left supraclavicular area.  5/11 TG (on synthroid)=10.5 (ab borderline).   7/11 TG=354 (off synthroid) (ab neg)  9/11 TG (on synthroid)=12 (ab neg)  7/12: TG (on synthroid)=13 (ab neg) 12/12: PET CT: neg 6/13: TG (on synthroid)=15 (ab neg) 12/13 TG (on synthroid)=26 (ab neg) 12/13: neck US neg 6/14: TG (on synthroid)=20 (ab neg) 12/14: TG(on synthroid)=25 (ab neg) 6/15: TG (on synthroid)=20 (ab neg) 6/16: TG (on synthroid)=10 (ab neg) 12/16: TG (on synthroid)=9 (ab neg).  7/17: TG (on synthroid)=13 (ab neg).  12/17: TG (on synthroid)=7 (ab neg). 6/18: TG (on synthroid)=7 (ab neg). 7/18: I-131 body scan (off synthroid) is neg.   5/19: TG (on synthroid)=7 (ab neg).   11/19: TG (on synthroid)=23 (ab neg).  He denies neck nodule or swelling.   Postsurgical hypothyroidism: he keeps a slightly suppressed TSH for his cancer.  Last month, synthroid was reduced to 175 mcg/day.  pt states he feels well in general.    Past Medical History:  Diagnosis Date  . G E R D 05/02/2009  . Hypercholesteremia   . HYPERTENSION 05/02/2009  . Hypocalcemia 07/04/2010  . HYPOTHYROIDISM, POSTSURGICAL 05/02/2009  . Malignant neoplasm of thyroid gland ([place]) 03/28/7034   follicular variant of papillary thyroid cancer 5/10 thyroidectomy, t3 M1 no 7/10 I-131 156 mci 7/10 post-therapy scan has uptake at the thryoid bed only 1/11 I-131 195 mci 1/11 post-therapy scan pos at the neck 1/11  pet scan pos for node at left supraclavicular area 5/11 thyroglobulin (on synthroid)=10.5 (ab borderline) 7/11 thyroglobulin=354 (off synthroid) (ab neg) 9/11 thyroglobulin =12 ab neg   . PERIPHERAL EDEMA 11/12/2009  . Sleep apnea     Past Surgical History:  Procedure Laterality Date  . COLONOSCOPY    . COLONOSCOPY N/A 12/27/2014   Procedure: COLONOSCOPY;  Surgeon: Rogene Houston, MD;  [facility];  Service: Endoscopy;  Laterality: N/A;  930  . ESOPHAGEAL DILATION N/A 11/03/2018   Procedure: ESOPHAGEAL DILATION;  Surgeon: Rogene Houston, MD;  [facility];  Service: Endoscopy;  Laterality: N/A;  . ESOPHAGOGASTRODUODENOSCOPY N/A 11/03/2018   Procedure: ESOPHAGOGASTRODUODENOSCOPY (EGD);  Surgeon: Rogene Houston, MD;  [facility];  Service: Endoscopy;  Laterality: N/A;  730  . TOTAL THYROIDECTOMY  Mar 02, 2009  . UVULOPALATOPHARYNGOPLASTY  2005    Social History   Socioeconomic History  . Marital status: Married    Spouse name: Not on file  . Number of children: Not on file  . Years of education: Not on file  . Highest education level: Not on file  Occupational History  . Occupation: Furniture conservator/restorer  Social Needs  . Financial resource strain: Not on file  . Food insecurity:    Worry: Not on file    Inability: Not on file  . Transportation needs:    Medical: Not on file    Non-medical:  Not on file  Tobacco Use  . Smoking status: Never Smoker  . Smokeless tobacco: Former Network engineer and Sexual Activity  . Alcohol use: No  . Drug use: No  . Sexual activity: Not on file  Lifestyle  . Physical activity:    Days per week: Not on file    Minutes per session: Not on file  . Stress: Not on file  Relationships  . Social connections:    Talks on phone: Not on file    Gets together: Not on file    Attends religious service: Not on file    Active member of club or organization: Not on file    Attends meetings of clubs or organizations: Not on file     Relationship status: Not on file  . Intimate partner violence:    Fear of current or ex partner: Not on file    Emotionally abused: Not on file    Physically abused: Not on file    Forced sexual activity: Not on file  Other Topics Concern  . Not on file  Social History Narrative  . Not on file    Current Outpatient Medications on File Prior to Visit  Medication Sig Dispense Refill  . acetaminophen (TYLENOL) 500 MG tablet Take 1,000 mg by mouth daily as needed for moderate pain or headache.     . Calcium-Magnesium-Zinc (CAL-MAG-ZINC PO) Take 2 tablets by mouth at bedtime.    . diphenhydramine-acetaminophen (TYLENOL PM) 25-500 MG TABS tablet Take 1 tablet by mouth at bedtime as needed (sleep).    Marland Kitchen levothyroxine (SYNTHROID) 200 MCG tablet Take 1 tablet (200 mcg total) by mouth daily before breakfast. 90 tablet 3  . lisinopril (PRINIVIL,ZESTRIL) 20 MG tablet Take 40 mg by mouth daily.    . Magnesium 400 MG TABS Take 400 mg by mouth daily.    . Melatonin 5 MG CAPS Take 5 mg by mouth at bedtime as needed (sleep).    . niacin 500 MG tablet Take 1,000 mg by mouth 2 (two) times daily.    Marland Kitchen omeprazole (PRILOSEC) 20 MG capsule Take 20 mg by mouth daily.    . vitamin B-12 (CYANOCOBALAMIN) 1000 MCG tablet Take 1,000 mcg by mouth daily.     No current facility-administered medications on file prior to visit.     Allergies  Allergen Reactions  . Alpha-Gal     Beef, lamb, and pork    Family History  Problem Relation Age of Onset  . Thyroid disease Mother   . Thyroid disease Sister        had thyroidectomy (benign)    BP 130/88 (BP Location: Left Arm, Patient Position: Sitting, Cuff Size: Large)   Pulse (!) 56   Ht 6' 2"  (1.88 m)   Wt 252 lb 12.8 oz (114.7 kg)   SpO2 96%   BMI 32.46 kg/m    Review of Systems Denies neck pain    Objective:   Physical Exam VITAL SIGNS:  See vs page GENERAL: no distress Neck: a healed scar is present.  I do not appreciate a nodule in the  thyroid or elsewhere in the neck  Lab Results  Component Value Date   TSH 0.15 (L) 02/28/2019   TG=7    Assessment & Plan:  Hypothyroidism: TSH is appropriately slightly suppressed.   PTC-FV: stable.  We'll follow for now.    Please continue the same medication. Please come back for a follow-up appointment in 6 months

## 2019-03-01 LAB — THYROGLOBULIN LEVEL: Thyroglobulin: 6.8 ng/mL

## 2019-03-01 LAB — THYROGLOBULIN ANTIBODY: Thyroglobulin Ab: <1 IU/mL (ref <=1)

## 2019-05-16 DIAGNOSIS — B029 Zoster without complications: Secondary | ICD-10-CM | POA: Diagnosis not present

## 2019-05-16 DIAGNOSIS — Z6831 Body mass index (BMI) 31.0-31.9, adult: Secondary | ICD-10-CM | POA: Diagnosis not present

## 2019-07-23 DIAGNOSIS — E039 Hypothyroidism, unspecified: Secondary | ICD-10-CM | POA: Diagnosis not present

## 2019-07-23 DIAGNOSIS — E782 Mixed hyperlipidemia: Secondary | ICD-10-CM | POA: Diagnosis not present

## 2019-07-23 DIAGNOSIS — I10 Essential (primary) hypertension: Secondary | ICD-10-CM | POA: Diagnosis not present

## 2019-07-23 DIAGNOSIS — K219 Gastro-esophageal reflux disease without esophagitis: Secondary | ICD-10-CM | POA: Diagnosis not present

## 2019-07-30 DIAGNOSIS — M10071 Idiopathic gout, right ankle and foot: Secondary | ICD-10-CM | POA: Diagnosis not present

## 2019-07-30 DIAGNOSIS — Z23 Encounter for immunization: Secondary | ICD-10-CM | POA: Diagnosis not present

## 2019-07-30 DIAGNOSIS — Z1212 Encounter for screening for malignant neoplasm of rectum: Secondary | ICD-10-CM | POA: Diagnosis not present

## 2019-07-30 DIAGNOSIS — Z0001 Encounter for general adult medical examination with abnormal findings: Secondary | ICD-10-CM | POA: Diagnosis not present

## 2019-07-30 DIAGNOSIS — I10 Essential (primary) hypertension: Secondary | ICD-10-CM | POA: Diagnosis not present

## 2019-07-30 DIAGNOSIS — E039 Hypothyroidism, unspecified: Secondary | ICD-10-CM | POA: Diagnosis not present

## 2019-07-30 DIAGNOSIS — C73 Malignant neoplasm of thyroid gland: Secondary | ICD-10-CM | POA: Diagnosis not present

## 2019-08-26 ENCOUNTER — Other Ambulatory Visit: Payer: Self-pay | Admitting: Endocrinology

## 2019-08-31 ENCOUNTER — Ambulatory Visit: Payer: BLUE CROSS/BLUE SHIELD | Admitting: Endocrinology

## 2019-08-31 ENCOUNTER — Other Ambulatory Visit: Payer: Self-pay

## 2019-08-31 ENCOUNTER — Encounter: Payer: Self-pay | Admitting: Endocrinology

## 2019-08-31 VITALS — BP 140/90 | HR 103 | Ht 74.0 in | Wt 248.8 lb

## 2019-08-31 DIAGNOSIS — E890 Postprocedural hypothyroidism: Secondary | ICD-10-CM

## 2019-08-31 DIAGNOSIS — C73 Malignant neoplasm of thyroid gland: Secondary | ICD-10-CM | POA: Diagnosis not present

## 2019-08-31 MED ORDER — LEVOTHYROXINE SODIUM 200 MCG PO TABS: 200.0000 ug | ORAL_TABLET | Freq: Every day | ORAL | 0 refills | Status: DC

## 2019-08-31 NOTE — Progress Notes (Signed)
Subjective:    Patient ID: Jorge Ellis, male    DOB: Apr 13, 1966, 53 y.o.   MRN: [id number]  HPI Pt returns for MALIGNANT NEOPLASM OF THYROID GLAND (ICD-193)  PTC-FV, stage-1 (due to age just less than 46, at time of Dx--he otherwise would have been stage-3).  5/10 thyroidectomy, T3 N1a M0     7/10 RAI 156 mci  7/10 post-therapy scan has uptake at the thyroid bed only  12/10: TG (off synthroid)=296 (ab neg) 1/11 RAI 195 mci  1/11 post-therapy scan pos at the neck  1/11 PET CT pos for node at left supraclavicular area.  5/11 TG (on synthroid)=10.5 (ab borderline).   7/11 TG=354 (off synthroid) (ab neg)  9/11 TG (on synthroid)=12 (ab neg)  7/12: TG (on synthroid)=13 (ab neg) 12/12: PET CT: neg 6/13: TG (on synthroid)=15 (ab neg) 12/13 TG (on synthroid)=26 (ab neg) 12/13: neck US neg 6/14: TG (on synthroid)=20 (ab neg) 12/14: TG(on synthroid)=25 (ab neg) 6/15: TG (on synthroid)=20 (ab neg) 6/16: TG (on synthroid)=10 (ab neg) 12/16: TG (on synthroid)=9 (ab neg).  7/17: TG (on synthroid)=13 (ab neg).  12/17: TG (on synthroid)=7 (ab neg). 6/18: TG (on synthroid)=7 (ab neg). 7/18: I-131 body scan (off synthroid) is neg.   5/19: TG (on synthroid)=7 (ab neg).   11/19: TG (on synthroid)=23 (ab neg).  He denies neck nodule or swelling.   Postsurgical hypothyroidism: goal is a slightly suppressed TSH for his cancer. pt states slight palpitations in the chest, and assoc insomnia.  Past Medical History:  Diagnosis Date  . G E R D 05/02/2009  . Hypercholesteremia   . HYPERTENSION 05/02/2009  . Hypocalcemia 07/04/2010  . HYPOTHYROIDISM, POSTSURGICAL 05/02/2009  . Malignant neoplasm of thyroid gland ([place]) 03/01/3874   follicular variant of papillary thyroid cancer 5/10 thyroidectomy, t3 M1 no 7/10 I-131 156 mci 7/10 post-therapy scan has uptake at the thryoid bed only 1/11 I-131 195 mci 1/11 post-therapy scan pos at the neck 1/11 pet scan pos for node at left supraclavicular area 5/11  thyroglobulin (on synthroid)=10.5 (ab borderline) 7/11 thyroglobulin=354 (off synthroid) (ab neg) 9/11 thyroglobulin =12 ab neg   . PERIPHERAL EDEMA 11/12/2009  . Sleep apnea     Past Surgical History:  Procedure Laterality Date  . COLONOSCOPY    . COLONOSCOPY N/A 12/27/2014   Procedure: COLONOSCOPY;  Surgeon: Rogene Houston, MD;  [facility];  Service: Endoscopy;  Laterality: N/A;  930  . ESOPHAGEAL DILATION N/A 11/03/2018   Procedure: ESOPHAGEAL DILATION;  Surgeon: Rogene Houston, MD;  [facility];  Service: Endoscopy;  Laterality: N/A;  . ESOPHAGOGASTRODUODENOSCOPY N/A 11/03/2018   Procedure: ESOPHAGOGASTRODUODENOSCOPY (EGD);  Surgeon: Rogene Houston, MD;  [facility];  Service: Endoscopy;  Laterality: N/A;  730  . TOTAL THYROIDECTOMY  Mar 02, 2009  . UVULOPALATOPHARYNGOPLASTY  2005    Social History   Socioeconomic History  . Marital status: Married    Spouse name: Not on file  . Number of children: Not on file  . Years of education: Not on file  . Highest education level: Not on file  Occupational History  . Occupation: Furniture conservator/restorer  Social Needs  . Financial resource strain: Not on file  . Food insecurity    Worry: Not on file    Inability: Not on file  . Transportation needs    Medical: Not on file    Non-medical: Not on file  Tobacco Use  . Smoking status: Never Smoker  .  Smokeless tobacco: Former Network engineer and Sexual Activity  . Alcohol use: No  . Drug use: No  . Sexual activity: Not on file  Lifestyle  . Physical activity    Days per week: Not on file    Minutes per session: Not on file  . Stress: Not on file  Relationships  . Social Herbalist on phone: Not on file    Gets together: Not on file    Attends religious service: Not on file    Active member of club or organization: Not on file    Attends meetings of clubs or organizations: Not on file    Relationship status: Not on file  . Intimate partner  violence    Fear of current or ex partner: Not on file    Emotionally abused: Not on file    Physically abused: Not on file    Forced sexual activity: Not on file  Other Topics Concern  . Not on file  Social History Narrative  . Not on file    Current Outpatient Medications on File Prior to Visit  Medication Sig Dispense Refill  . acetaminophen (TYLENOL) 500 MG tablet Take 1,000 mg by mouth daily as needed for moderate pain or headache.     . Calcium-Magnesium-Zinc (CAL-MAG-ZINC PO) Take 2 tablets by mouth at bedtime.    . diphenhydramine-acetaminophen (TYLENOL PM) 25-500 MG TABS tablet Take 1 tablet by mouth at bedtime as needed (sleep).    Marland Kitchen lisinopril (PRINIVIL,ZESTRIL) 20 MG tablet Take 40 mg by mouth daily.    . Magnesium 400 MG TABS Take 400 mg by mouth daily.    . Melatonin 5 MG CAPS Take 5 mg by mouth at bedtime as needed (sleep).    . niacin 500 MG tablet Take 1,000 mg by mouth 2 (two) times daily.    Marland Kitchen omeprazole (PRILOSEC) 20 MG capsule Take 20 mg by mouth daily.    . vitamin B-12 (CYANOCOBALAMIN) 1000 MCG tablet Take 1,000 mcg by mouth daily.     No current facility-administered medications on file prior to visit.     Allergies  Allergen Reactions  . Alpha-Gal     Beef, lamb, and pork    Family History  Problem Relation Age of Onset  . Thyroid disease Mother   . Thyroid disease Sister        had thyroidectomy (benign)    BP 140/90 (BP Location: Left Arm, Patient Position: Sitting, Cuff Size: Normal)   Pulse (!) 103   Ht 6' 2"  (1.88 m)   Wt 248 lb 12.8 oz (112.9 kg)   SpO2 97%   BMI 31.94 kg/m    Review of Systems Denies neck pain and swelling     Objective:   Physical Exam VITAL SIGNS:  See vs page GENERAL: no distress Neck: a healed scar is present.  I do not appreciate a nodule in the thyroid or elsewhere in the neck.    Lab Results  Component Value Date   TSH 0.09 (L) 08/31/2019   TG=4    Assessment & Plan:  Palpitations.  We discussed.   He wants to continue suppressive synthroid Hypothyroidism: overreplaced.  I reduced synthroid PTC-FV: improved. We'll continue to hold off on any more RAI for now.   Patient Instructions  Blood tests are requested for you today.  We'll let you know about the results.  Your blood pressure is high today.  Please follow this up with Dr Quillian Quince.  Please come back for a follow-up appointment in 6 months.

## 2019-08-31 NOTE — Patient Instructions (Addendum)
Blood tests are requested for you today.  We'll let you know about the results.  Your blood pressure is high today.  Please follow this up with Dr Quillian Quince.   Please come back for a follow-up appointment in 6 months.

## 2019-09-01 LAB — TSH: TSH: 0.09 u[IU]/mL — ABNORMAL LOW (ref 0.35–4.50)

## 2019-09-01 LAB — THYROGLOBULIN LEVEL: Thyroglobulin: 3.9 ng/mL

## 2019-09-01 LAB — THYROGLOBULIN ANTIBODY: Thyroglobulin Ab: <1 IU/mL (ref <=1)

## 2019-09-01 LAB — T4, FREE: Free T4: 1.63 ng/dL — ABNORMAL HIGH (ref 0.60–1.60)

## 2019-09-01 MED ORDER — LEVOTHYROXINE SODIUM 175 MCG PO TABS: 175.0000 ug | ORAL_TABLET | Freq: Every day | ORAL | 3 refills | Status: AC

## 2019-12-13 ENCOUNTER — Other Ambulatory Visit: Payer: Self-pay | Admitting: Physician Assistant

## 2019-12-13 DIAGNOSIS — D485 Neoplasm of uncertain behavior of skin: Secondary | ICD-10-CM | POA: Diagnosis not present

## 2019-12-28 ENCOUNTER — Encounter: Payer: Self-pay | Admitting: *Deleted

## 2020-01-12 ENCOUNTER — Other Ambulatory Visit: Payer: Self-pay

## 2020-01-12 ENCOUNTER — Ambulatory Visit: Payer: BC Managed Care – PPO | Admitting: Physician Assistant

## 2020-01-12 ENCOUNTER — Encounter: Payer: Self-pay | Admitting: Physician Assistant

## 2020-01-12 DIAGNOSIS — D239 Other benign neoplasm of skin, unspecified: Secondary | ICD-10-CM

## 2020-01-12 DIAGNOSIS — D696 Thrombocytopenia, unspecified: Secondary | ICD-10-CM | POA: Diagnosis not present

## 2020-01-12 DIAGNOSIS — L905 Scar conditions and fibrosis of skin: Secondary | ICD-10-CM | POA: Diagnosis not present

## 2020-01-12 NOTE — Patient Instructions (Addendum)

## 2020-01-12 NOTE — Progress Notes (Signed)
   Follow-Up Visit   Subjective  Jorge Ellis is a 54 y.o. male who presents for the following: Procedure (Patient here today for widershave on right inner thigh. No new concerns today).  The following portions of the chart were reviewed this encounter and updated as appropriate: Tobacco  Allergies  Meds  Problems  Med Hx  Surg Hx  Fam Hx      Review of Systems: No other skin or systemic complaints.  Objective  Well appearing patient in no apparent distress; mood and affect are within normal limits.  A focused examination was performed including right inner thigh. Relevant physical exam findings are noted in the Assessment and Plan.  Objective  right inner thigh: Pink macule [id number]   Assessment & Plan  Dysplastic nevi right inner thigh  Epidermal / dermal shaving - right inner thigh  Lesion length (cm):  1 Lesion width (cm):  1 Margin per side (cm):  0.2 Total excision diameter (cm):  1.4 Informed consent: discussed and consent obtained   Timeout: patient name, date of birth, surgical site, and procedure verified   Procedure prep:  Patient was prepped and draped in usual sterile fashion Prep type:  Chlorhexidine Anesthesia: the lesion was anesthetized in a standard fashion   Anesthetic:  1% lidocaine w/ epinephrine 1-100,000 local infiltration Instrument used: DermaBlade   Hemostasis achieved with: aluminum chloride   Outcome: patient tolerated procedure well   Post-procedure details: sterile dressing applied and wound care instructions given   Dressing type: petrolatum gauze, petrolatum and bandage    PR SHAV SKIN LES 1.1-2.0 CM TRUNK,ARM,LEG - right inner thigh  Specimen 1 - Surgical pathology Differential Diagnosis:rule out atypia Check Margins: No Pink macule [id number]  Thrombocytopenia (HCC)

## 2020-01-25 DIAGNOSIS — C73 Malignant neoplasm of thyroid gland: Secondary | ICD-10-CM | POA: Diagnosis not present

## 2020-01-25 DIAGNOSIS — Z9889 Other specified postprocedural states: Secondary | ICD-10-CM | POA: Diagnosis not present

## 2020-01-25 DIAGNOSIS — Z9189 Other specified personal risk factors, not elsewhere classified: Secondary | ICD-10-CM | POA: Diagnosis not present

## 2020-02-01 DIAGNOSIS — Z23 Encounter for immunization: Secondary | ICD-10-CM | POA: Diagnosis not present

## 2020-02-04 DIAGNOSIS — E038 Other specified hypothyroidism: Secondary | ICD-10-CM | POA: Diagnosis not present

## 2020-02-04 DIAGNOSIS — E039 Hypothyroidism, unspecified: Secondary | ICD-10-CM | POA: Diagnosis not present

## 2020-02-04 DIAGNOSIS — M10071 Idiopathic gout, right ankle and foot: Secondary | ICD-10-CM | POA: Diagnosis not present

## 2020-02-04 DIAGNOSIS — C73 Malignant neoplasm of thyroid gland: Secondary | ICD-10-CM | POA: Diagnosis not present

## 2020-02-08 DIAGNOSIS — C73 Malignant neoplasm of thyroid gland: Secondary | ICD-10-CM | POA: Diagnosis not present

## 2020-02-08 DIAGNOSIS — R221 Localized swelling, mass and lump, neck: Secondary | ICD-10-CM | POA: Diagnosis not present

## 2020-02-11 DIAGNOSIS — E782 Mixed hyperlipidemia: Secondary | ICD-10-CM | POA: Diagnosis not present

## 2020-02-11 DIAGNOSIS — I10 Essential (primary) hypertension: Secondary | ICD-10-CM | POA: Diagnosis not present

## 2020-02-11 DIAGNOSIS — M10071 Idiopathic gout, right ankle and foot: Secondary | ICD-10-CM | POA: Diagnosis not present

## 2020-02-11 DIAGNOSIS — E039 Hypothyroidism, unspecified: Secondary | ICD-10-CM | POA: Diagnosis not present

## 2020-02-24 DIAGNOSIS — Z23 Encounter for immunization: Secondary | ICD-10-CM | POA: Diagnosis not present

## 2020-02-29 ENCOUNTER — Ambulatory Visit: Payer: BC Managed Care – PPO | Admitting: Endocrinology

## 2020-04-13 DIAGNOSIS — T1501XA Foreign body in cornea, right eye, initial encounter: Secondary | ICD-10-CM | POA: Diagnosis not present

## 2020-07-17 ENCOUNTER — Other Ambulatory Visit: Payer: Self-pay

## 2020-07-17 ENCOUNTER — Ambulatory Visit: Payer: BC Managed Care – PPO | Admitting: Physician Assistant

## 2020-07-17 ENCOUNTER — Encounter: Payer: Self-pay | Admitting: Physician Assistant

## 2020-07-17 DIAGNOSIS — Z86018 Personal history of other benign neoplasm: Secondary | ICD-10-CM | POA: Diagnosis not present

## 2020-07-17 DIAGNOSIS — D1801 Hemangioma of skin and subcutaneous tissue: Secondary | ICD-10-CM | POA: Diagnosis not present

## 2020-07-18 NOTE — Progress Notes (Signed)
   Follow-Up Visit   Subjective  Jorge Ellis is a 54 y.o. male who presents for the following: Follow-up (6 mth -right innther thigh- no concerns, healed good).   The following portions of the chart were reviewed this encounter and updated as appropriate: Tobacco  Allergies  Meds  Problems  Med Hx  Surg Hx  Fam Hx      Objective  Well appearing patient in no apparent distress; mood and affect are within normal limits.  A full examination was performed including scalp, head, eyes, ears, nose, lips, neck, chest,bilateral lower extremities. All findings within normal limits unless otherwise noted below.  Objective  Right inner -thigh: Scars clear. No atypical nevi   Objective  Neck - Anterior: Dark purple macule   Assessment & Plan  History of dysplastic nevus Right inner -thigh  observe  Hemangioma of skin Neck - Anterior  observe    I, Alizaya Oshea, PA-C, have reviewed all documentation's for this visit.  The documentation on 07/18/20 for the exam, diagnosis, procedures and orders are all accurate and complete.

## 2020-08-27 LAB — IFOBT (OCCULT BLOOD): IFOBT: POSITIVE

## 2020-11-05 ENCOUNTER — Encounter (INDEPENDENT_AMBULATORY_CARE_PROVIDER_SITE_OTHER): Payer: Self-pay

## 2020-11-05 ENCOUNTER — Ambulatory Visit (INDEPENDENT_AMBULATORY_CARE_PROVIDER_SITE_OTHER): Payer: BC Managed Care – PPO | Admitting: Gastroenterology

## 2020-11-05 ENCOUNTER — Other Ambulatory Visit: Payer: Self-pay

## 2020-11-05 ENCOUNTER — Encounter (INDEPENDENT_AMBULATORY_CARE_PROVIDER_SITE_OTHER): Payer: Self-pay | Admitting: Gastroenterology

## 2020-11-05 ENCOUNTER — Telehealth (INDEPENDENT_AMBULATORY_CARE_PROVIDER_SITE_OTHER): Payer: Self-pay

## 2020-11-05 DIAGNOSIS — K625 Hemorrhage of anus and rectum: Secondary | ICD-10-CM | POA: Diagnosis not present

## 2020-11-05 DIAGNOSIS — R195 Other fecal abnormalities: Secondary | ICD-10-CM | POA: Diagnosis not present

## 2020-11-05 MED ORDER — NA SULFATE-K SULFATE-MG SULF 17.5-3.13-1.6 GM/177ML PO SOLN: 354.0000 mL | Freq: Once | ORAL | 0 refills | Status: AC

## 2020-11-05 NOTE — Patient Instructions (Addendum)
Schedule colonoscopy Take prunes and kiwi daily Start Benefiber fiber supplements daily to increase stool bulk If no improvement with previous measures, can start taking Miralax 1 cap every day

## 2020-11-05 NOTE — Telephone Encounter (Signed)
LeighAnn Jazzman Loughmiller, CMA  

## 2020-11-05 NOTE — Progress Notes (Signed)
Jorge Ellis, M.D. Gastroenterology & Hepatology [facility] [address] [place], [place] [address]  Primary Care Physician: Jorge Bis, MD [place] [address]  I will communicate my assessment and recommendations to the referring MD via EMR.  Problems: 1. Rectal bleeding  History of Present Illness: Jorge Ellis is a 55 y.o. male with PMH thyroid cancer s/p thyroidectomy and radiation x2, GERD, who presents for follow up of rectal bleeding.  The patient was last seen on 09/20/2018. At that time, the patient had an EGD ordered due to dysphagia.  He underwent an EGD I described below. Last EGD: 11/03/2018 - Normal esophagus. - Z-line regular, 40 cm from the incisors. - 2 cm hiatal hernia. - No endoscopic esophageal abnormality to explain patient's dysphagia. Esophagus dilated. - Normal stomach. - Normal duodenal bulb and second portion of the duodenum. - No specimens collected.  After this, his dysphagia has much more improved.  Patient reports that for the last 3 months he has presented some blood when he wipes. Endorses that he has some soreness when he has blood in his toilet paper.  Reports that the bleeding gets worse when he is lifting objects.  However, with episodes happen intermittently. He has had to strain sometimes to have a BM, but has at leas 1 BM every day.  Does not take any fiber.  Patient presents results from at least PCPs office which showed positive Hemosure on 08/27/2020.  CBC on 08/18/2020 showed a hemoglobin of 15.3, MCV 90, white blood cell count 4.3, and platelets 145.  Takes omeprazole for GERD, this controls his heartburn adequately.  The patient denies having any nausea, dysphagia, odynophagia, vomiting, fever, chills, melena, hematemesis, abdominal distention, abdominal pain, diarrhea, jaundice, pruritus or weight loss.  Last Colonoscopy: 2016 - hemorrhoids, one polyp removed (path  was normal tissue)  FHx: neg for any gastrointestinal/liver disease, grandfather melanoma Social: neg smoking, alcohol or illicit drug use Surgical: thyroidectomy  Past Medical History: Past Medical History:  Diagnosis Date  . Atypical mole 08/16/2009   R axilla-slight  . Atypical mole 06/12/2010   R outer wrist-slight, R calf-slight  . Atypical mole 06/12/2010   L upper post thigh-moderate  . Atypical mole 12/13/2019   Right inner thigh-atypical junctional lent.wider shave done on 01/12/2020  . G E R D 05/02/2009  . Hypercholesteremia   . HYPERTENSION 05/02/2009  . Hypocalcemia 07/04/2010  . HYPOTHYROIDISM, POSTSURGICAL 05/02/2009  . Malignant neoplasm of thyroid gland ([place]) 0000000   follicular variant of papillary thyroid cancer 5/10 thyroidectomy, t3 M1 no 7/10 I-131 156 mci 7/10 post-therapy scan has uptake at the thryoid bed only 1/11 I-131 195 mci 1/11 post-therapy scan pos at the neck 1/11 pet scan pos for node at left supraclavicular area 5/11 thyroglobulin (on synthroid)=10.5 (ab borderline) 7/11 thyroglobulin=354 (off synthroid) (ab neg) 9/11 thyroglobulin =12 ab neg   . PERIPHERAL EDEMA 11/12/2009  . Sleep apnea     Past Surgical History: Past Surgical History:  Procedure Laterality Date  . COLONOSCOPY    . COLONOSCOPY N/A 12/27/2014   Procedure: COLONOSCOPY;  Surgeon: Rogene Houston, MD;  [facility];  Service: Endoscopy;  Laterality: N/A;  930  . ESOPHAGEAL DILATION N/A 11/03/2018   Procedure: ESOPHAGEAL DILATION;  Surgeon: Rogene Houston, MD;  Location: [facility];  Service: Endoscopy;  Laterality: N/A;  . ESOPHAGOGASTRODUODENOSCOPY N/A 11/03/2018   Procedure: ESOPHAGOGASTRODUODENOSCOPY (EGD);  Surgeon: Rogene Houston, MD;  Location: [facility];  Service: Endoscopy;  Laterality: N/A;  730  . TOTAL THYROIDECTOMY  Mar 02, 2009  . UVULOPALATOPHARYNGOPLASTY  2005    Family History: Family History  Problem Relation Age of Onset  . Thyroid disease Mother    . Thyroid disease Sister        had thyroidectomy (benign)    Social History: Social History   Tobacco Use  Smoking Status Never Smoker  Smokeless Tobacco Former Systems developer   Social History   Substance and Sexual Activity  Alcohol Use No   Social History   Substance and Sexual Activity  Drug Use No    Allergies: Allergies  Allergen Reactions  . Alpha-Gal     Beef, lamb, and pork    Medications: Current Outpatient Medications  Medication Sig Dispense Refill  . acetaminophen (TYLENOL) 500 MG tablet Take 1,000 mg by mouth daily as needed for moderate pain or headache.    . Calcium-Magnesium-Zinc (CAL-MAG-ZINC PO) Take 2 tablets by mouth at bedtime.    . cholecalciferol (VITAMIN D3) 25 MCG (1000 UNIT) tablet Take 1,000 Units by mouth daily.    Marland Kitchen levothyroxine (SYNTHROID) 175 MCG tablet Take 1 tablet (175 mcg total) by mouth daily before breakfast. 90 tablet 3  . lisinopril (PRINIVIL,ZESTRIL) 20 MG tablet Take 40 mg by mouth daily.    . niacin 500 MG tablet Take 1,000 mg by mouth 2 (two) times daily.    Marland Kitchen omeprazole (PRILOSEC) 20 MG capsule Take 20 mg by mouth daily.    . diphenhydramine-acetaminophen (TYLENOL PM) 25-500 MG TABS tablet Take 1 tablet by mouth at bedtime as needed (sleep). (Patient not taking: No sig reported)     No current facility-administered medications for this visit.    Review of Systems: GENERAL: negative for malaise, night sweats HEENT: No changes in hearing or vision, no nose bleeds or other nasal problems. NECK: Negative for lumps, goiter, pain and significant neck swelling RESPIRATORY: Negative for cough, wheezing CARDIOVASCULAR: Negative for chest pain, leg swelling, palpitations, orthopnea GI: SEE HPI MUSCULOSKELETAL: Negative for joint pain or swelling, back pain, and muscle pain. SKIN: Negative for lesions, rash PSYCH: Negative for sleep disturbance, mood disorder and recent psychosocial stressors. HEMATOLOGY Negative for prolonged bleeding,  bruising easily, and swollen nodes. ENDOCRINE: Negative for cold or heat intolerance, polyuria, polydipsia and goiter. NEURO: negative for tremor, gait imbalance, syncope and seizures. The remainder of the review of systems is noncontributory.   Physical Exam: BP (!) 149/98 (BP Location: Left Arm, Patient Position: Sitting, Cuff Size: Large)   Pulse 79   Temp 98.4 F (36.9 C) (Oral)   Ht 6\' 2"  (1.88 m)   Wt 246 lb (111.6 kg)   BMI 31.58 kg/m  GENERAL: The patient is AO x3, in no acute distress. Obese. HEENT: Head is normocephalic and atraumatic. EOMI are intact. Mouth is well hydrated and without lesions. NECK: Supple. No masses LUNGS: Clear to auscultation. No presence of rhonchi/wheezing/rales. Adequate chest expansion HEART: RRR, normal s1 and s2. ABDOMEN: Soft, nontender, no guarding, no peritoneal signs, and nondistended. BS +. No masses. EXTREMITIES: Without any cyanosis, clubbing, rash, lesions or edema. NEUROLOGIC: AOx3, no focal motor deficit. SKIN: no jaundice, no rashes  Imaging/Labs: as above  I personally reviewed and interpreted the available labs, imaging and endoscopic files.  Impression and Plan: Jorge Ellis is a 55 y.o. male with PMH thyroid cancer s/p thyroidectomy and radiation x2, GERD, who presents for follow up of rectal bleeding.  Patient presented  episodes of rectal bleeding intermittently usually after having to strain to have bowel movement.  It is very possible that his presentation is related to anorectal pathology such as hemorrhoids, but given the symptoms we will need to proceed with a colonoscopy.  Patient was advised to increase intake of fiber with dietary supplements and prunes/kiwi.  If he has no improvement then he will can take MiraLAX on a daily basis.  The patient understood and agreed.  - Schedule colonoscopy - Take prunes and kiwi daily - Start Benefiber fiber supplements daily to increase stool bulk - If no improvement with previous  measures, can start taking Miralax 1 cap every day  All questions were answered.      Harvel Quale, MD Gastroenterology and Hepatology [facility]

## 2020-11-27 ENCOUNTER — Encounter (INDEPENDENT_AMBULATORY_CARE_PROVIDER_SITE_OTHER): Payer: Self-pay

## 2021-01-21 ENCOUNTER — Other Ambulatory Visit (HOSPITAL_COMMUNITY)
Admission: RE | Admit: 2021-01-21 | Discharge: 2021-01-21 | Disposition: A | Discharge status: Home / Self Care | Payer: BC Managed Care – PPO | Source: Ambulatory Visit | Attending: Gastroenterology | Admitting: Gastroenterology

## 2021-01-21 ENCOUNTER — Other Ambulatory Visit: Payer: Self-pay

## 2021-01-21 DIAGNOSIS — K644 Residual hemorrhoidal skin tags: Secondary | ICD-10-CM | POA: Diagnosis not present

## 2021-01-21 DIAGNOSIS — Z01812 Encounter for preprocedural laboratory examination: Secondary | ICD-10-CM | POA: Insufficient documentation

## 2021-01-21 DIAGNOSIS — Z85850 Personal history of malignant neoplasm of thyroid: Secondary | ICD-10-CM | POA: Diagnosis not present

## 2021-01-21 DIAGNOSIS — K625 Hemorrhage of anus and rectum: Secondary | ICD-10-CM | POA: Diagnosis not present

## 2021-01-21 DIAGNOSIS — Z79899 Other long term (current) drug therapy: Secondary | ICD-10-CM | POA: Diagnosis not present

## 2021-01-21 DIAGNOSIS — Z20822 Contact with and (suspected) exposure to covid-19: Secondary | ICD-10-CM | POA: Insufficient documentation

## 2021-01-21 DIAGNOSIS — Z79890 Hormone replacement therapy (postmenopausal): Secondary | ICD-10-CM | POA: Diagnosis not present

## 2021-01-21 DIAGNOSIS — K648 Other hemorrhoids: Secondary | ICD-10-CM | POA: Diagnosis not present

## 2021-01-21 DIAGNOSIS — K5730 Diverticulosis of large intestine without perforation or abscess without bleeding: Secondary | ICD-10-CM | POA: Diagnosis not present

## 2021-01-21 DIAGNOSIS — L918 Other hypertrophic disorders of the skin: Secondary | ICD-10-CM | POA: Diagnosis not present

## 2021-01-21 LAB — SARS CORONAVIRUS 2 (TAT 6-24 HRS): SARS Coronavirus 2: NEGATIVE

## 2021-01-22 ENCOUNTER — Encounter (INDEPENDENT_AMBULATORY_CARE_PROVIDER_SITE_OTHER): Payer: Self-pay | Admitting: *Deleted

## 2021-01-22 ENCOUNTER — Ambulatory Visit (HOSPITAL_COMMUNITY): Payer: BC Managed Care – PPO | Admitting: Anesthesiology

## 2021-01-22 ENCOUNTER — Encounter (HOSPITAL_COMMUNITY)
Admission: RE | Disposition: A | Discharge status: Home / Self Care | Payer: Self-pay | Source: Home / Self Care | Attending: Gastroenterology

## 2021-01-22 ENCOUNTER — Encounter (HOSPITAL_COMMUNITY): Payer: Self-pay | Admitting: Gastroenterology

## 2021-01-22 ENCOUNTER — Ambulatory Visit (HOSPITAL_COMMUNITY)
Admission: RE | Admit: 2021-01-22 | Discharge: 2021-01-22 | Disposition: A | Discharge status: Home / Self Care | Payer: BC Managed Care – PPO | Attending: Gastroenterology | Admitting: Gastroenterology

## 2021-01-22 ENCOUNTER — Other Ambulatory Visit: Payer: Self-pay

## 2021-01-22 DIAGNOSIS — Z20822 Contact with and (suspected) exposure to covid-19: Secondary | ICD-10-CM | POA: Insufficient documentation

## 2021-01-22 DIAGNOSIS — K5730 Diverticulosis of large intestine without perforation or abscess without bleeding: Secondary | ICD-10-CM

## 2021-01-22 DIAGNOSIS — K648 Other hemorrhoids: Secondary | ICD-10-CM

## 2021-01-22 DIAGNOSIS — K625 Hemorrhage of anus and rectum: Secondary | ICD-10-CM

## 2021-01-22 DIAGNOSIS — L918 Other hypertrophic disorders of the skin: Secondary | ICD-10-CM | POA: Insufficient documentation

## 2021-01-22 DIAGNOSIS — Z79890 Hormone replacement therapy (postmenopausal): Secondary | ICD-10-CM | POA: Insufficient documentation

## 2021-01-22 DIAGNOSIS — Z85850 Personal history of malignant neoplasm of thyroid: Secondary | ICD-10-CM | POA: Insufficient documentation

## 2021-01-22 DIAGNOSIS — K644 Residual hemorrhoidal skin tags: Secondary | ICD-10-CM

## 2021-01-22 DIAGNOSIS — Z79899 Other long term (current) drug therapy: Secondary | ICD-10-CM | POA: Insufficient documentation

## 2021-01-22 HISTORY — PX: COLONOSCOPY WITH PROPOFOL: SHX5780

## 2021-01-22 LAB — HM COLONOSCOPY

## 2021-01-22 SURGERY — COLONOSCOPY WITH PROPOFOL
Anesthesia: General

## 2021-01-22 MED ORDER — LACTATED RINGERS IV SOLN: INTRAVENOUS | Status: DC

## 2021-01-22 MED ORDER — PROPOFOL 10 MG/ML IV BOLUS
INTRAVENOUS | Status: AC
  Filled 2021-01-22: qty 40

## 2021-01-22 MED ORDER — PROPOFOL 10 MG/ML IV BOLUS
INTRAVENOUS | Status: DC | PRN
  Administered 2021-01-22: 100 mg via INTRAVENOUS

## 2021-01-22 MED ORDER — STERILE WATER FOR IRRIGATION IR SOLN
Status: DC | PRN
  Administered 2021-01-22: 100 mL

## 2021-01-22 MED ORDER — PROPOFOL 500 MG/50ML IV EMUL
INTRAVENOUS | Status: DC | PRN
  Administered 2021-01-22: 100 ug/kg/min via INTRAVENOUS

## 2021-01-22 NOTE — Anesthesia Preprocedure Evaluation (Addendum)
Anesthesia Evaluation  Patient identified by MRN, date of birth, ID band Patient awake    Reviewed: Allergy & Precautions, NPO status , Patient's Chart, lab work & pertinent test results  History of Anesthesia Complications Negative for: history of anesthetic complications  Airway Mallampati: III  TM Distance: >3 FB Neck ROM: Full    Dental  (+) Dental Advisory Given   Pulmonary sleep apnea (mild OSA after UPPP) ,    Pulmonary exam normal breath sounds clear to auscultation       Cardiovascular Exercise Tolerance: Good hypertension, Pt. on medications Normal cardiovascular exam Rhythm:Regular Rate:Normal     Neuro/Psych negative neurological ROS  negative psych ROS   GI/Hepatic Neg liver ROS, GERD  Medicated,  Endo/Other  Hypothyroidism   Renal/GU negative Renal ROS  negative genitourinary   Musculoskeletal negative musculoskeletal ROS (+)   Abdominal   Peds negative pediatric ROS (+)  Hematology negative hematology ROS (+)   Anesthesia Other Findings   Reproductive/Obstetrics negative OB ROS                            Anesthesia Physical Anesthesia Plan  ASA: II  Anesthesia Plan: General   Post-op Pain Management:    Induction: Intravenous  PONV Risk Score and Plan: Propofol infusion  Airway Management Planned: Nasal Cannula and Natural Airway  Additional Equipment:   Intra-op Plan:   Post-operative Plan:   Informed Consent: I have reviewed the patients History and Physical, chart, labs and discussed the procedure including the risks, benefits and alternatives for the proposed anesthesia with the patient or authorized representative who has indicated his/her understanding and acceptance.     Dental advisory given  Plan Discussed with: CRNA and Surgeon  Anesthesia Plan Comments:        Anesthesia Quick Evaluation

## 2021-01-22 NOTE — Anesthesia Postprocedure Evaluation (Signed)
Anesthesia Post Note  Patient: Jorge Ellis  Procedure(s) Performed: COLONOSCOPY WITH PROPOFOL (N/A )  Patient location during evaluation: Endoscopy Anesthesia Type: General Level of consciousness: awake and alert Pain management: pain level controlled Vital Signs Assessment: post-procedure vital signs reviewed and stable Respiratory status: spontaneous breathing, nonlabored ventilation, respiratory function stable and patient connected to nasal cannula oxygen Cardiovascular status: blood pressure returned to baseline and stable Postop Assessment: no apparent nausea or vomiting Anesthetic complications: no   No complications documented.   Last Vitals:  Vitals:   01/22/21 0748 01/22/21 1045  BP: (!) 142/95 109/71  Pulse:  88  Resp: 17 16  Temp: 36.9 C 36.7 C  SpO2: 98% 96%    Last Pain:  Vitals:   01/22/21 1045  TempSrc: Oral  PainSc: 0-No pain                 Talitha Givens

## 2021-01-22 NOTE — Discharge Instructions (Signed)
Colonoscopy, Adult, Care After This sheet gives you information about how to care for yourself after your procedure. Your doctor may also give you more specific instructions. If you have problems or questions, call your doctor. What can I expect after the procedure? After the procedure, it is common to have:  A small amount of blood in your poop (stool) for 24 hours.  Some gas.  Mild cramping or bloating in your belly (abdomen). Follow these instructions at home: Eating and drinking  Drink enough fluid to keep your pee (urine) pale yellow.  Follow instructions from your doctor about what you cannot eat or drink.  Return to your normal diet as told by your doctor. Avoid heavy or fried foods that are hard to digest.   Activity  Rest as told by your doctor.  Do not sit for a long time without moving. Get up to take short walks every 1-2 hours. This is important. Ask for help if you feel weak or unsteady.  Return to your normal activities as told by your doctor. Ask your doctor what activities are safe for you. To help cramping and bloating:  Try walking around.  Put heat on your belly as told by your doctor. Use the heat source that your doctor recommends, such as a moist heat pack or a heating pad. ? Put a towel between your skin and the heat source. ? Leave the heat on for 20-30 minutes. ? Remove the heat if your skin turns bright red. This is very important if you are unable to feel pain, heat, or cold. You may have a greater risk of getting burned.   General instructions  If you were given a medicine to help you relax (sedative) during your procedure, it can affect you for many hours. Do not drive or use machinery until your doctor says that it is safe.  For the first 24 hours after the procedure: ? Do not sign important documents. ? Do not drink alcohol. ? Do your daily activities more slowly than normal. ? Eat foods that are soft and easy to digest.  Take  over-the-counter or prescription medicines only as told by your doctor.  Keep all follow-up visits as told by your doctor. This is important. Contact a doctor if:  You have blood in your poop 2-3 days after the procedure. Get help right away if:  You have more than a small amount of blood in your poop.  You see large clumps of tissue (blood clots) in your poop.  Your belly is swollen.  You feel like you may vomit (nauseous).  You vomit.  You have a fever.  You have belly pain that gets worse, and medicine does not help your pain. Summary  After the procedure, it is common to have a small amount of blood in your poop. You may also have mild cramping and bloating in your belly.  If you were given a medicine to help you relax (sedative) during your procedure, it can affect you for many hours. Do not drive or use machinery until your doctor says that it is safe.  Get help right away if you have a lot of blood in your poop, feel like you may vomit, have a fever, or have more belly pain. This information is not intended to replace advice given to you by your health care provider. Make sure you discuss any questions you have with your health care provider. Document Revised: 08/19/2019 Document Reviewed: 05/09/2019 Elsevier Patient Education  2021  [employer] Diverticulosis  Diverticulosis is a condition that develops when small pouches (diverticula) form in the wall of the large intestine (colon). The colon is where water is absorbed and stool (feces) is formed. The pouches form when the inside layer of the colon pushes through weak spots in the outer layers of the colon. You may have a few pouches or many of them. The pouches usually do not cause problems unless they become inflamed or infected. When this happens, the condition is called diverticulitis. What are the causes? The cause of this condition is not known. What increases the risk? The following factors may make you more  likely to develop this condition:  Being older than age 52. Your risk for this condition increases with age. Diverticulosis is rare among people younger than age 110. By age 59, many people have it.  Eating a low-fiber diet.  Having frequent constipation.  Being overweight.  Not getting enough exercise.  Smoking.  Taking over-the-counter pain medicines, like aspirin and ibuprofen.  Having a family history of diverticulosis. What are the signs or symptoms? In most people, there are no symptoms of this condition. If you do have symptoms, they may include:  Bloating.  Cramps in the abdomen.  Constipation or diarrhea.  Pain in the lower left side of the abdomen. How is this diagnosed? Because diverticulosis usually has no symptoms, it is most often diagnosed during an exam for other colon problems. The condition may be diagnosed by:  Using a flexible scope to examine the colon (colonoscopy).  Taking an X-ray of the colon after dye has been put into the colon (barium enema).  Having a CT scan. How is this treated? You may not need treatment for this condition. Your health care provider may recommend treatment to prevent problems. You may need treatment if you have symptoms or if you previously had diverticulitis. Treatment may include:  Eating a high-fiber diet.  Taking a fiber supplement.  Taking a live bacteria supplement (probiotic).  Taking medicine to relax your colon.   Follow these instructions at home: Medicines  Take over-the-counter and prescription medicines only as told by your health care provider.  If told by your health care provider, take a fiber supplement or probiotic. Constipation prevention Your condition may cause constipation. To prevent or treat constipation, you may need to:  Drink enough fluid to keep your urine pale yellow.  Take over-the-counter or prescription medicines.  Eat foods that are high in fiber, such as beans, whole grains, and  fresh fruits and vegetables.  Limit foods that are high in fat and processed sugars, such as fried or sweet foods.   General instructions  Try not to strain when you have a bowel movement.  Keep all follow-up visits as told by your health care provider. This is important. Contact a health care provider if you:  Have pain in your abdomen.  Have bloating.  Have cramps.  Have not had a bowel movement in 3 days. Get help right away if:  Your pain gets worse.  Your bloating becomes very bad.  You have a fever or chills, and your symptoms suddenly get worse.  You vomit.  You have bowel movements that are bloody or black.  You have bleeding from your rectum. Summary  Diverticulosis is a condition that develops when small pouches (diverticula) form in the wall of the large intestine (colon).  You may have a few pouches or many of them.  This condition is most often diagnosed  during an exam for other colon problems.  Treatment may include increasing the fiber in your diet, taking supplements, or taking medicines. This information is not intended to replace advice given to you by your health care provider. Make sure you discuss any questions you have with your health care provider. Document Revised: 05/12/2019 Document Reviewed: 05/12/2019 Elsevier Patient Education  2021 [place]. Hemorrhoids Hemorrhoids are swollen veins that may develop:  In the butt (rectum). These are called internal hemorrhoids.  Around the opening of the butt (anus). These are called external hemorrhoids. Hemorrhoids can cause pain, itching, or bleeding. Most of the time, they do not cause serious problems. They usually get better with diet changes, lifestyle changes, and other home treatments. What are the causes? This condition may be caused by:  Having trouble pooping (constipation).  Pushing hard (straining) to poop.  Watery poop (diarrhea).  Pregnancy.  Being very overweight  (obese).  Sitting for long periods of time.  Heavy lifting or other activity that causes you to strain.  Anal sex.  Riding a bike for a long period of time. What are the signs or symptoms? Symptoms of this condition include:  Pain.  Itching or soreness in the butt.  Bleeding from the butt.  Leaking poop.  Swelling in the area.  One or more lumps around the opening of your butt. How is this diagnosed? A doctor can often diagnose this condition by looking at the affected area. The doctor may also:  Do an exam that involves feeling the area with a gloved hand (digital rectal exam).  Examine the area inside your butt using a small tube (anoscope).  Order blood tests. This may be done if you have lost a lot of blood.  Have you get a test that involves looking inside the colon using a flexible tube with a camera on the end (sigmoidoscopy or colonoscopy). How is this treated? This condition can usually be treated at home. Your doctor may tell you to change what you eat, make lifestyle changes, or try home treatments. If these do not help, procedures can be done to remove the hemorrhoids or make them smaller. These may involve:  Placing rubber bands at the base of the hemorrhoids to cut off their blood supply.  Injecting medicine into the hemorrhoids to shrink them.  Shining a type of light energy onto the hemorrhoids to cause them to fall off.  Doing surgery to remove the hemorrhoids or cut off their blood supply. Follow these instructions at home: Eating and drinking  Eat foods that have a lot of fiber in them. These include whole grains, beans, nuts, fruits, and vegetables.  Ask your doctor about taking products that have added fiber (fibersupplements).  Reduce the amount of fat in your diet. You can do this by: ? Eating low-fat dairy products. ? Eating less red meat. ? Avoiding processed foods.  Drink enough fluid to keep your pee (urine) pale yellow.   Managing  pain and swelling  Take a warm-water bath (sitz bath) for 20 minutes to ease pain. Do this 3-4 times a day. You may do this in a bathtub or using a portable sitz bath that fits over the toilet.  If told, put ice on the painful area. It may be helpful to use ice between your warm baths. ? Put ice in a plastic bag. ? Place a towel between your skin and the bag. ? Leave the ice on for 20 minutes, 2-3 times a day.  General instructions  Take over-the-counter and prescription medicines only as told by your doctor. ? Medicated creams and medicines may be used as told.  Exercise often. Ask your doctor how much and what kind of exercise is best for you.  Go to the bathroom when you have the urge to poop. Do not wait.  Avoid pushing too hard when you poop.  Keep your butt dry and clean. Use wet toilet paper or moist towelettes after pooping.  Do not sit on the toilet for a long time.  Keep all follow-up visits as told by your doctor. This is important. Contact a doctor if you:  Have pain and swelling that do not get better with treatment or medicine.  Have trouble pooping.  Cannot poop.  Have pain or swelling outside the area of the hemorrhoids. Get help right away if you have:  Bleeding that will not stop. Summary  Hemorrhoids are swollen veins in the butt or around the opening of the butt.  They can cause pain, itching, or bleeding.  Eat foods that have a lot of fiber in them. These include whole grains, beans, nuts, fruits, and vegetables.  Take a warm-water bath (sitz bath) for 20 minutes to ease pain. Do this 3-4 times a day. This information is not intended to replace advice given to you by your health care provider. Make sure you discuss any questions you have with your health care provider. Document Revised: 10/21/2018 Document Reviewed: 03/04/2018 Elsevier Patient Education  2021 [place] are being discharged to home.  Resume your previous diet.  Your  physician has recommended a repeat colonoscopy in five years for screening purposes due to prep quality.  Can take Miralax to improve bowel movement frequency.

## 2021-01-22 NOTE — Op Note (Signed)
[facility] Patient Name: Jorge Ellis Procedure Date: 01/22/2021 10:00 AM MRN: [id number] Date of Birth: 12/16/1965 Attending MD: [name] ,  CSN: [id number] Age: 55 Admit Type: Outpatient Procedure:                Colonoscopy Indications:              Rectal bleeding Providers:                [name], Lambert Mody, Kristine L.                            Risa Grill, Technician Referring MD:              Medicines:                Monitored Anesthesia Care Complications:            No immediate complications. Estimated Blood Loss:     Estimated blood loss: none. Procedure:                Pre-Anesthesia Assessment:                           - Prior to the procedure, a History and Physical                            was performed, and patient medications, allergies                            and sensitivities were reviewed. The patient's                            tolerance of previous anesthesia was reviewed.                           - The risks and benefits of the procedure and the                            sedation options and risks were discussed with the                            patient. All questions were answered and informed                            consent was obtained.                           - ASA Grade Assessment: II - A patient with mild                            systemic disease.                           After obtaining informed consent, the colonoscope                            was passed under direct vision. Throughout the  procedure, the patient's blood pressure, pulse, and                            oxygen saturations were monitored continuously. The                            [id number]-[id number] ([id number]) was introduced through the                            anus and advanced to the the terminal ileum. The                            colonoscopy was performed without difficulty. The                             patient tolerated the procedure well. The quality                            of the bowel preparation was fair. Scope In: 10:20:09 AM Scope Out: 10:41:44 AM Scope Withdrawal Time: 0 hours 16 minutes 40 seconds  Total Procedure Duration: 0 hours 21 minutes 35 seconds  Findings:      Skin tags were found on perianal exam.      A few medium-mouthed diverticula were found in the sigmoid colon.      Non-bleeding internal hemorrhoids were found during retroflexion. The       hemorrhoids were small.      The terminal ileum appeared normal. Impression:               - Preparation of the colon was fair.                           - Perianal skin tags found on perianal exam.                           - Diverticulosis in the sigmoid colon.                           - Non-bleeding internal hemorrhoids.                           - The examined portion of the ileum was normal.                           - No specimens collected. Moderate Sedation:      Per Anesthesia Care Recommendation:           - Discharge patient to home (ambulatory).                           - Resume previous diet.                           - Repeat colonoscopy in 5 years for screening                            purposes  due to prep quality.                           - Can take Miralax to improve bowel movement                            frequency. Procedure Code(s):        --- Professional ---                           [phone], Colonoscopy, flexible; diagnostic, including                            collection of specimen(s) by brushing or washing,                            when performed (separate procedure) Diagnosis Code(s):        --- Professional ---                           K64.8, Other hemorrhoids                           K64.4, Residual hemorrhoidal skin tags                           K62.5, Hemorrhage of anus and rectum                           K57.30, Diverticulosis of large intestine without                             perforation or abscess without bleeding CPT copyright 2019 American Medical Association. All rights reserved. The codes documented in this report are preliminary and upon coder review may  be revised to meet current compliance requirements. [name], MD [name],  01/22/2021 10:46:10 AM This report has been signed electronically. Number of Addenda: 0

## 2021-01-22 NOTE — Transfer of Care (Signed)
Immediate Anesthesia Transfer of Care Note  Patient: Jorge Ellis  Procedure(s) Performed: COLONOSCOPY WITH PROPOFOL (N/A )  Patient Location: Endoscopy Unit  Anesthesia Type:General  Level of Consciousness: awake, alert  and oriented  Airway & Oxygen Therapy: Patient Spontanous Breathing  Post-op Assessment: Report given to RN, Post -op Vital signs reviewed and stable and Patient moving all extremities X 4  Post vital signs: Reviewed and stable  Last Vitals:  Vitals Value Taken Time  BP    Temp    Pulse    Resp    SpO2      Last Pain:  Vitals:   01/22/21 1015  TempSrc:   PainSc: 0-No pain      Patients Stated Pain Goal: 9 (03/79/44 4619)  Complications: No complications documented.

## 2021-01-22 NOTE — H&P (Signed)
Jorge Ellis is an 55 y.o. male.   Chief Complaint: rectal bleeding HPI: Jorge Ellis is a 55 y.o. male with PMH thyroid cancer s/p thyroidectomy and radiation x2, GERD, who presents for follow up of rectal bleeding.  Patient reports he had some episodes of rectal bleeding when wiping close to 6 months ago, but these episodes have resolved. Never had this before but reported having very hard stools in the past. Denied having any dyschezia, nausea, vomiting, fever, chills, melena, hematemesis, abdominal distention, abdominal pain, diarrhea, jaundice, pruritus or weight loss. Most recent Hb on 08/18/20 was 15.3. Has not had any more rectal bleeding since last time seen in clinic.  Last Colonoscopy: 2016 - hemorrhoids, one polyp removed (path was normal tissue)  Past Medical History:  Diagnosis Date  . Atypical mole 08/16/2009   R axilla-slight  . Atypical mole 06/12/2010   R outer wrist-slight, R calf-slight  . Atypical mole 06/12/2010   L upper post thigh-moderate  . Atypical mole 12/13/2019   Right inner thigh-atypical junctional lent.wider shave done on 01/12/2020  . G E R D 05/02/2009  . Hypercholesteremia   . HYPERTENSION 05/02/2009  . Hypocalcemia 07/04/2010  . HYPOTHYROIDISM, POSTSURGICAL 05/02/2009  . Malignant neoplasm of thyroid gland ([place]) 01/27/8767   follicular variant of papillary thyroid cancer 5/10 thyroidectomy, t3 M1 no 7/10 I-131 156 mci 7/10 post-therapy scan has uptake at the thryoid bed only 1/11 I-131 195 mci 1/11 post-therapy scan pos at the neck 1/11 pet scan pos for node at left supraclavicular area 5/11 thyroglobulin (on synthroid)=10.5 (ab borderline) 7/11 thyroglobulin=354 (off synthroid) (ab neg) 9/11 thyroglobulin =12 ab neg   . PERIPHERAL EDEMA 11/12/2009  . Sleep apnea     Past Surgical History:  Procedure Laterality Date  . COLONOSCOPY    . COLONOSCOPY N/A 12/27/2014   Procedure: COLONOSCOPY;  Surgeon: Rogene Houston, MD;  Location: [facility];  Service:  Endoscopy;  Laterality: N/A;  930  . ESOPHAGEAL DILATION N/A 11/03/2018   Procedure: ESOPHAGEAL DILATION;  Surgeon: Rogene Houston, MD;  Location: [facility];  Service: Endoscopy;  Laterality: N/A;  . ESOPHAGOGASTRODUODENOSCOPY N/A 11/03/2018   Procedure: ESOPHAGOGASTRODUODENOSCOPY (EGD);  Surgeon: Rogene Houston, MD;  Location: [facility];  Service: Endoscopy;  Laterality: N/A;  730  . TOTAL THYROIDECTOMY  Mar 02, 2009  . UVULOPALATOPHARYNGOPLASTY  2005    Family History  Problem Relation Age of Onset  . Thyroid disease Mother   . Thyroid disease Sister        had thyroidectomy (benign)   Social History:  reports that he has never smoked. He has quit using smokeless tobacco. He reports that he does not drink alcohol and does not use drugs.  Allergies:  Allergies  Allergen Reactions  . Alpha-Gal     Beef, lamb, and pork    Medications Prior to Admission  Medication Sig Dispense Refill  . acetaminophen (TYLENOL) 500 MG tablet Take 1,000 mg by mouth daily as needed for moderate pain or headache.    . Cholecalciferol (VITAMIN D) 125 MCG (5000 UT) CAPS Take 5,000 Units by mouth daily.    Marland Kitchen levothyroxine (SYNTHROID) 175 MCG tablet Take 1 tablet (175 mcg total) by mouth daily before breakfast. 90 tablet 3  . lisinopril (PRINIVIL,ZESTRIL) 20 MG tablet Take 40 mg by mouth daily.    Marland Kitchen omeprazole (PRILOSEC) 20 MG capsule Take 20 mg by mouth daily.    . magnesium oxide (MAG-OX) 400 MG tablet Take 400  mg by mouth daily.    . niacin 500 MG tablet Take 1,000 mg by mouth 2 (two) times daily.      Results for orders placed or performed during the hospital encounter of 01/21/21 (from the past 48 hour(s))  SARS CORONAVIRUS 2 (TAT 6-24 HRS) Nasopharyngeal Nasopharyngeal Swab     Status: None   Collection Time: 01/21/21  8:00 AM   Specimen: Nasopharyngeal Swab  Result Value Ref Range   SARS Coronavirus 2 NEGATIVE NEGATIVE    Comment: (NOTE) SARS-CoV-2 target nucleic acids are NOT  DETECTED.  The SARS-CoV-2 RNA is generally detectable in upper and lower respiratory specimens during the acute phase of infection. Negative results do not preclude SARS-CoV-2 infection, do not rule out co-infections with other pathogens, and should not be used as the sole basis for treatment or other patient management decisions. Negative results must be combined with clinical observations, patient history, and epidemiological information. The expected result is Negative.  Fact Sheet for Patients: SugarRoll.be  Fact Sheet for Healthcare Providers: https://www.woods-mathews.com/  This test is not yet approved or cleared by the [place] FDA and  has been authorized for detection and/or diagnosis of SARS-CoV-2 by FDA under an Emergency Use Authorization (EUA). This EUA will remain  in effect (meaning this test can be used) for the duration of the COVID-19 declaration under Se ction 564(b)(1) of the Act, 21 U.S.C. section 360bbb-3(b)(1), unless the authorization is terminated or revoked sooner.  Performed at [facility], [place] [address] [place], [place] [address]    No results found.  Review of Systems  Constitutional: Negative.   HENT: Negative.   Eyes: Negative.   Respiratory: Negative.   Cardiovascular: Negative.   Endocrine: Negative.   Genitourinary: Negative.   Musculoskeletal: Negative.   Skin: Negative.   Allergic/Immunologic: Negative.   Neurological: Negative.   Hematological: Negative.   Psychiatric/Behavioral: Negative.     Blood pressure (!) 142/95, temperature 98.5 F (36.9 C), temperature source Oral, resp. rate 17, height 6\' 2"  (1.88 m), weight 109.8 kg, SpO2 98 %. Physical Exam  GENERAL: The patient is AO x3, in no acute distress. HEENT: Head is normocephalic and atraumatic. EOMI are intact. Mouth is well hydrated and without lesions. NECK: Supple. No masses LUNGS: Clear to auscultation. No presence  of rhonchi/wheezing/rales. Adequate chest expansion HEART: RRR, normal s1 and s2. ABDOMEN: Soft, nontender, no guarding, no peritoneal signs, and nondistended. BS +. No masses. EXTREMITIES: Without any cyanosis, clubbing, rash, lesions or edema. NEUROLOGIC: AOx3, no focal motor deficit. SKIN: no jaundice, no rashes  Assessment/Plan Jorge Ellis is a 55 y.o. male with PMH thyroid cancer s/p thyroidectomy and radiation x2, GERD, who presents for follow up of rectal bleeding. Will proceed with colonoscopy.  Harvel Quale, MD 01/22/2021, 8:33 AM

## 2021-01-28 ENCOUNTER — Encounter (HOSPITAL_COMMUNITY): Payer: Self-pay | Admitting: Gastroenterology

## 2021-04-05 ENCOUNTER — Telehealth: Payer: Self-pay | Admitting: Endocrinology

## 2021-04-05 NOTE — Telephone Encounter (Signed)
Patient came into the office and completed a Medical Records Release.  Faxed to Medical Records 04/05/21 @ 10:13AM

## 2021-07-18 ENCOUNTER — Ambulatory Visit: Payer: BC Managed Care – PPO | Admitting: Physician Assistant

## 2022-06-19 ENCOUNTER — Ambulatory Visit: Payer: BC Managed Care – PPO | Admitting: Physician Assistant

## 2022-09-07 ENCOUNTER — Encounter (INDEPENDENT_AMBULATORY_CARE_PROVIDER_SITE_OTHER): Payer: Self-pay | Admitting: Gastroenterology
